# Patient Record
Sex: Female | Born: 1935 | Race: White | Hispanic: No | Smoking: Never smoker
Health system: Southern US, Community
[De-identification: ages and names within clinical notes are randomized; demographics above are authoritative.]

## PROBLEM LIST (undated history)

## (undated) DIAGNOSIS — N3941 Urge incontinence: Secondary | ICD-10-CM

## (undated) DIAGNOSIS — N390 Urinary tract infection, site not specified: Secondary | ICD-10-CM

## (undated) DIAGNOSIS — F0390 Unspecified dementia without behavioral disturbance: Secondary | ICD-10-CM

## (undated) DIAGNOSIS — I10 Essential (primary) hypertension: Secondary | ICD-10-CM

## (undated) DIAGNOSIS — F419 Anxiety disorder, unspecified: Secondary | ICD-10-CM

## (undated) DIAGNOSIS — I2510 Atherosclerotic heart disease of native coronary artery without angina pectoris: Secondary | ICD-10-CM

## (undated) DIAGNOSIS — D649 Anemia, unspecified: Secondary | ICD-10-CM

## (undated) DIAGNOSIS — E039 Hypothyroidism, unspecified: Secondary | ICD-10-CM

## (undated) DIAGNOSIS — E876 Hypokalemia: Secondary | ICD-10-CM

---

## 2012-08-28 ENCOUNTER — Emergency Department: Payer: Self-pay | Admitting: Emergency Medicine

## 2013-04-01 ENCOUNTER — Emergency Department: Payer: Self-pay | Admitting: Emergency Medicine

## 2015-02-22 ENCOUNTER — Emergency Department: Payer: Medicare Other

## 2015-02-22 ENCOUNTER — Emergency Department
Admission: EM | Admit: 2015-02-22 | Discharge: 2015-02-22 | Disposition: A | Discharge status: Home / Self Care | Payer: Medicare Other | Attending: Student | Admitting: Student

## 2015-02-22 DIAGNOSIS — Z043 Encounter for examination and observation following other accident: Secondary | ICD-10-CM | POA: Insufficient documentation

## 2015-02-22 DIAGNOSIS — Y939 Activity, unspecified: Secondary | ICD-10-CM | POA: Diagnosis not present

## 2015-02-22 DIAGNOSIS — Y929 Unspecified place or not applicable: Secondary | ICD-10-CM | POA: Diagnosis not present

## 2015-02-22 DIAGNOSIS — I10 Essential (primary) hypertension: Secondary | ICD-10-CM | POA: Insufficient documentation

## 2015-02-22 DIAGNOSIS — Z79899 Other long term (current) drug therapy: Secondary | ICD-10-CM | POA: Insufficient documentation

## 2015-02-22 DIAGNOSIS — Y999 Unspecified external cause status: Secondary | ICD-10-CM | POA: Insufficient documentation

## 2015-02-22 DIAGNOSIS — F0390 Unspecified dementia without behavioral disturbance: Secondary | ICD-10-CM | POA: Diagnosis not present

## 2015-02-22 DIAGNOSIS — W19XXXA Unspecified fall, initial encounter: Secondary | ICD-10-CM

## 2015-02-22 DIAGNOSIS — W01198A Fall on same level from slipping, tripping and stumbling with subsequent striking against other object, initial encounter: Secondary | ICD-10-CM | POA: Insufficient documentation

## 2015-02-22 HISTORY — DX: Hypothyroidism, unspecified: E03.9

## 2015-02-22 HISTORY — DX: Urge incontinence: N39.41

## 2015-02-22 HISTORY — DX: Hypokalemia: E87.6

## 2015-02-22 HISTORY — DX: Urinary tract infection, site not specified: N39.0

## 2015-02-22 HISTORY — DX: Atherosclerotic heart disease of native coronary artery without angina pectoris: I25.10

## 2015-02-22 HISTORY — DX: Essential (primary) hypertension: I10

## 2015-02-22 HISTORY — DX: Anemia, unspecified: D64.9

## 2015-02-22 HISTORY — DX: Anxiety disorder, unspecified: F41.9

## 2015-02-22 LAB — URINALYSIS COMPLETE WITH MICROSCOPIC (ARMC ONLY)
BACTERIA UA: NONE SEEN
Bilirubin Urine: NEGATIVE
Glucose, UA: NEGATIVE mg/dL
Hgb urine dipstick: NEGATIVE
KETONES UR: NEGATIVE mg/dL
Nitrite: NEGATIVE
Protein, ur: NEGATIVE mg/dL
Specific Gravity, Urine: 1.009 (ref 1.005–1.030)
pH: 6 (ref 5.0–8.0)

## 2015-02-22 NOTE — Discharge Instructions (Signed)
Fall Prevention and Home Safety  Falls cause injuries and can affect all age groups. It is possible to use preventive measures to significantly decrease the likelihood of falls. There are many simple measures which can make your home safer and prevent falls.  OUTDOORS   Repair cracks and edges of walkways and driveways.   Remove high doorway thresholds.   Trim shrubbery on the main path into your home.   Have good outside lighting.   Clear walkways of tools, rocks, debris, and clutter.   Check that handrails are not broken and are securely fastened. Both sides of steps should have handrails.   Have leaves, snow, and ice cleared regularly.   Use sand or salt on walkways during winter months.   In the garage, clean up grease or oil spills.  BATHROOM   Install night lights.   Install grab bars by the toilet and in the tub and shower.   Use non-skid mats or decals in the tub or shower.   Place a plastic non-slip stool in the shower to sit on, if needed.   Keep floors dry and clean up all water on the floor immediately.   Remove soap buildup in the tub or shower on a regular basis.   Secure bath mats with non-slip, double-sided rug tape.   Remove throw rugs and tripping hazards from the floors.  BEDROOMS   Install night lights.   Make sure a bedside light is easy to reach.   Do not use oversized bedding.   Keep a telephone by your bedside.   Have a firm chair with side arms to use for getting dressed.   Remove throw rugs and tripping hazards from the floor.  KITCHEN   Keep handles on pots and pans turned toward the center of the stove. Use back burners when possible.   Clean up spills quickly and allow time for drying.   Avoid walking on wet floors.   Avoid hot utensils and knives.   Position shelves so they are not too high or low.   Place commonly used objects within easy reach.   If necessary, use a sturdy step stool with a grab bar when reaching.   Keep electrical cables out of the  way.   Do not use floor polish or wax that makes floors slippery. If you must use wax, use non-skid floor wax.   Remove throw rugs and tripping hazards from the floor.  STAIRWAYS   Never leave objects on stairs.   Place handrails on both sides of stairways and use them. Fix any loose handrails. Make sure handrails on both sides of the stairways are as long as the stairs.   Check carpeting to make sure it is firmly attached along stairs. Make repairs to worn or loose carpet promptly.   Avoid placing throw rugs at the top or bottom of stairways, or properly secure the rug with carpet tape to prevent slippage. Get rid of throw rugs, if possible.   Have an electrician put in a light switch at the top and bottom of the stairs.  OTHER FALL PREVENTION TIPS   Wear low-heel or rubber-soled shoes that are supportive and fit well. Wear closed toe shoes.   When using a stepladder, make sure it is fully opened and both spreaders are firmly locked. Do not climb a closed stepladder.   Add color or contrast paint or tape to grab bars and handrails in your home. Place contrasting color strips on first and last   steps.   Learn and use mobility aids as needed. Install an electrical emergency response system.   Turn on lights to avoid dark areas. Replace light bulbs that burn out immediately. Get light switches that glow.   Arrange furniture to create clear pathways. Keep furniture in the same place.   Firmly attach carpet with non-skid or double-sided tape.   Eliminate uneven floor surfaces.   Select a carpet pattern that does not visually hide the edge of steps.   Be aware of all pets.  OTHER HOME SAFETY TIPS   Set the water temperature for 120 F (48.8 C).   Keep emergency numbers on or near the telephone.   Keep smoke detectors on every level of the home and near sleeping areas.  Document Released: 09/22/2002 Document Revised: 04/02/2012 Document Reviewed: 12/22/2011  ExitCare Patient Information 2015  [employer]. This information is not intended to replace advice given to you by your health care provider. Make sure you discuss any questions you have with your health care provider.      Dementia  Dementia is a general term for problems with brain function. A person with dementia has memory loss and a hard time with at least one other brain function such as thinking, speaking, or problem solving. Dementia can affect social functioning, how you do your job, your mood, or your personality. The changes may be hidden for a long time. The earliest forms of this disease are usually not detected by family or friends.  Dementia can be:   Irreversible.   Potentially reversible.   Partially reversible.   Progressive. This means it can get worse over time.  CAUSES   Irreversible dementia causes may include:   Degeneration of brain cells (Alzheimer disease or Lewy body dementia).   Multiple small strokes (vascular dementia).   Infection (chronic meningitis or Creutzfeldt-Jakob disease).   Frontotemporal dementia. This affects younger people, age 40 to 70, compared to those who have Alzheimer disease.   Dementia associated with other disorders like Parkinson disease, Huntington disease, or HIV-associated dementia.  Potentially or partially reversible dementia causes may include:   Medicines.   Metabolic causes such as excessive alcohol intake, vitamin B12 deficiency, or thyroid disease.   Masses or pressure in the brain such as a tumor, blood clot, or hydrocephalus.  SIGNS AND SYMPTOMS   Symptoms are often hard to detect. Family members or coworkers may not notice them early in the disease process. Different people with dementia may have different symptoms. Symptoms can include:   A hard time with memory, especially recent memory. Long-term memory may not be impaired.   Asking the same question multiple times or forgetting something someone just said.   A hard time speaking your thoughts or finding certain  words.   A hard time solving problems or performing familiar tasks (such as how to use a telephone).   Sudden changes in mood.   Changes in personality, especially increasing moodiness or mistrust.   Depression.   A hard time understanding complex ideas that were never a problem in the past.  DIAGNOSIS   There are no specific tests for dementia.    Your health care provider may recommend a thorough evaluation. This is because some forms of dementia can be reversible. The evaluation will likely include a physical exam and getting a detailed history from you and a family member. The history often gives the best clues and suggestions for a diagnosis.   Memory testing may be done. A   detailed brain function evaluation called neuropsychologic testing may be helpful.   Lab tests and brain imaging (such as a CT scan or MRI scan) are sometimes important.   Sometimes observation and re-evaluation over time is very helpful.  TREATMENT   Treatment depends on the cause.    If the problem is a vitamin deficiency, it may be helped or cured with supplements.   For dementias such as Alzheimer disease, medicines are available to stabilize or slow the course of the disease. There are no cures for this type of dementia.   Your health care provider can help direct you to groups, organizations, and other health care providers to help with decisions in the care of you or your loved one.  HOME CARE INSTRUCTIONS  The care of individuals with dementia is varied and dependent upon the progression of the dementia. The following suggestions are intended for the person living with, or caring for, the person with dementia.   Create a safe environment.   Remove the locks on bathroom doors to prevent the person from accidentally locking himself or herself in.   Use childproof latches on kitchen cabinets and any place where cleaning supplies, chemicals, or alcohol are kept.   Use childproof covers in unused electrical  outlets.   Install childproof devices to keep doors and windows secured.   Remove stove knobs or install safety knobs and an automatic shut-off on the stove.   Lower the temperature on water heaters.   Label medicines and keep them locked up.   Secure knives, lighters, matches, power tools, and guns, and keep these items out of reach.   Keep the house free from clutter. Remove rugs or anything that might contribute to a fall.   Remove objects that might break and hurt the person.   Make sure lighting is good, both inside and outside.   Install grab rails as needed.   Use a monitoring device to alert you to falls or other needs for help.   Reduce confusion.   Keep familiar objects and people around.   Use night lights or dim lights at night.   Label items or areas.   Use reminders, notes, or directions for daily activities or tasks.   Keep a simple, consistent routine for waking, meals, bathing, dressing, and bedtime.   Create a calm, quiet environment.   Place large clocks and calendars prominently.   Display emergency numbers and home address near all telephones.   Use cues to establish different times of the day. An example is to open curtains to let the natural light in during the day.    Use effective communication.   Choose simple words and short sentences.   Use a gentle, calm tone of voice.   Be careful not to interrupt.   If the person is struggling to find a word or communicate a thought, try to provide the word or thought.   Ask one question at a time. Allow the person ample time to answer questions. Repeat the question again if the person does not respond.   Reduce nighttime restlessness.   Provide a comfortable bed.   Have a consistent nighttime routine.   Ensure a regular walking or physical activity schedule. Involve the person in daily activities as much as possible.   Limit napping during the day.   Limit caffeine.   Attend social events that stimulate rather than  overwhelm the senses.   Encourage good nutrition and hydration.   Reduce distractions during   meal times and snacks.   Avoid foods that are too hot or too cold.   Monitor chewing and swallowing ability.   Continue with routine vision, hearing, dental, and medical screenings.   Give medicines only as directed by the health care provider.   Monitor driving abilities. Do not allow the person to drive when safe driving is no longer possible.   Register with an identification program which could provide location assistance in the event of a missing person situation.  SEEK MEDICAL CARE IF:    New behavioral problems start such as moodiness, aggressiveness, or seeing things that are not there (hallucinations).   Any new problem with brain function happens. This includes problems with balance, speech, or falling a lot.   Problems with swallowing develop.   Any symptoms of other illness happen.  Small changes or worsening in any aspect of brain function can be a sign that the illness is getting worse. It can also be a sign of another medical illness such as infection. Seeing a health care provider right away is important.  SEEK IMMEDIATE MEDICAL CARE IF:    A fever develops.   New or worsened confusion develops.   New or worsened sleepiness develops.   Staying awake becomes hard to do.  Document Released: 03/28/2001 Document Revised: 02/16/2014 Document Reviewed: 02/27/2011  [employer] Patient Information 2015 [employer]. This information is not intended to replace advice given to you by your health care provider. Make sure you discuss any questions you have with your health care provider.

## 2015-02-22 NOTE — ED Notes (Signed)
Called report to Callawayherrelle, [facility]. She is aware of patients disposition and that she will be transported back to the facility.

## 2015-02-22 NOTE — ED Notes (Signed)
[facility] "pt has been stumbling around and had a fall today they helped her up but not visible injuries.".  Pt typically ambulatory with no assist.  Pt has no complaints.  Pt alert to self on ly.

## 2015-02-22 NOTE — ED Notes (Signed)
Patient is resting comfortably. 

## 2015-02-22 NOTE — ED Provider Notes (Signed)
[facility] Emergency Department Provider Note  ____________________________________________  Time seen: Approximately 5:03 PM  I have reviewed the triage vital signs and the nursing notes.   HISTORY  Chief Complaint Fall  History of present illness limited second to the patient's chronic dementia.  HPI Wanda Mitchell is a 81 y.o. female history of hypertension, dementia, anemia, frequent urinary tract infections presents for evaluation of witnessed fall. The patient was walking in the courtyard at [facility] where she lives, she was witnessed to lose her balance, and fell hitting her head, no loss of consciousness. When staff attempted to stand her up she seemed "unsteady" on her feet. Staff is also concerned that she may have a urinary tract infection given that her urine has had a strong odor recently. This occurred suddenly today. The patient does not appear to be in any pain or complain of pain. Nothing makes her symptoms better or worse. According to staff at her living facility she has otherwise been in her usual state of health without recent illness.   Past Medical History  Diagnosis Date  . UTI (urinary tract infection)   . Urgency incontinence   . CAD (coronary artery disease)   . Hypothyroid   . Hypertension   . Anemia   . Hypokalemia   . Anxiety     There are no active problems to display for this patient.   History reviewed. No pertinent past surgical history.  Current Outpatient Rx  Name  Route  Sig  Dispense  Refill  . acetaminophen (TYLENOL) 325 MG tablet   Oral   Take 325 mg by mouth every 4 (four) hours as needed for mild pain, fever or headache.         . Cholecalciferol 1000 UNITS tablet   Oral   Take 1,000 Units by mouth every morning.         . divalproex (DEPAKOTE SPRINKLE) 125 MG capsule   Oral   Take 250 mg by mouth 2 (two) times daily.         Marland Kitchen docusate sodium (COLACE) 100 MG capsule   Oral   Take 100 mg  by mouth 2 (two) times daily.         . furosemide (LASIX) 20 MG tablet   Oral   Take 20 mg by mouth daily.         . hydrocortisone 2.5 % cream   Topical   Apply 1 application topically 2 (two) times daily.         Marland Kitchen menthol-cetylpyridinium (CEPACOL) 3 MG lozenge   Oral   Take 1 lozenge by mouth as needed for sore throat.         . Multiple Vitamins-Minerals (MULTIVITAMIN ADULT PO)   Oral   Take 1 tablet by mouth daily.         . potassium chloride (MICRO-K) 10 MEQ CR capsule   Oral   Take 10 mEq by mouth daily.         . QUEtiapine (SEROQUEL) 25 MG tablet   Oral   Take 12.5 mg by mouth 2 (two) times daily.           Allergies Review of patient's allergies indicates no known allergies.  No family history on file.  Social History History  Substance Use Topics  . Smoking status: Never Smoker   . Smokeless tobacco: Not on file  . Alcohol Use: No    Review of Systems Constitutional: No fever/chills  Cardiovascular: Denies chest pain. Respiratory: Denies shortness of breath. Gastrointestinal: no vomiting.  No diarrhea. Genitourinary: +strong-smelling urine  Patient unable to contribute to review of systems secondary to dementia. Review of systems partially obtained [facility].  ____________________________________________   PHYSICAL EXAM:  VITAL SIGNS: ED Triage Vitals  Enc Vitals Group     BP 02/22/15 1658 103/47 mmHg     Pulse Rate 02/22/15 1658 79     Resp 02/22/15 1658 18     Temp 02/22/15 1658 98.4 F (36.9 C)     Temp Source 02/22/15 1658 Oral     SpO2 02/22/15 1658 97 %     Weight 02/22/15 1658 90 lb (40.824 kg)     Height 02/22/15 1658 5' (1.524 m)     Head Cir --      Peak Flow --      Pain Score --      Pain Loc --      Pain Edu? --      Excl. in GC? --     Constitutional: Alert; pleasantly demented. Well appearing and in no acute distress. Eyes: Conjunctivae are normal. PERRL. EOMI. Head:  Atraumatic. Nose: No congestion/rhinnorhea. Mouth/Throat: Mucus membranes are moist.  Oropharynx non-erythematous. Neck: No stridor. No cervical spine tenderness to palpation. Cardiovascular: Normal rate, regular rhythm. Grossly normal heart sounds.  Good peripheral circulation. Respiratory: Normal respiratory effort.  No retractions. Lungs CTAB. Gastrointestinal: Soft and nontender. No distention. No abdominal bruits. No CVA tenderness. Genitourinary: deferred Musculoskeletal: No lower extremity tenderness nor edema.  No joint effusions. No midline tenderness throughout the T or L-spine, pelvis is stable to rock and compression, full range of motion of bilateral hips Neurologic: No gross focal neurologic deficits are appreciated. Speech is normal. No gait instability. Skin:  Skin is warm, dry and intact. No rash noted. Psychiatric: Mood and affect are normal. Speech and behavior are normal.  ____________________________________________   LABS (all labs ordered are listed, but only abnormal results are displayed)  Labs Reviewed  URINALYSIS COMPLETEWITH MICROSCOPIC ([facility])  - Abnormal; Notable for the following:    Color, Urine YELLOW (*)    APPearance CLEAR (*)    Leukocytes, UA TRACE (*)    Squamous Epithelial / LPF 0-5 (*)    All other components within normal limits  URINE CULTURE   ____________________________________________  EKG  none ____________________________________________  RADIOLOGY  CLINICAL DATA: Stomach in around. Fall.  EXAM: CT HEAD WITHOUT CONTRAST  CT CERVICAL SPINE WITHOUT CONTRAST  TECHNIQUE: Multidetector CT imaging of the head and cervical spine was performed following the standard protocol without intravenous contrast. Multiplanar CT image reconstructions of the cervical spine were also generated.  COMPARISON: 04/01/2013  FINDINGS: CT HEAD FINDINGS  There is prominence of the sulci and ventricles compatible with brain atrophy.  Mild low attenuation within the subcortical and periventricular white matter is noted. The midline is maintained. There is no acute intracranial hemorrhage or mass identified.  CT CERVICAL SPINE FINDINGS  There is an anterolisthesis of C3 on C4 which is likely related to spondylosis. Straightening of normal cervical lordosis is noted. There is multi level disc space narrowing and ventral endplate spurring which extends from C3-4 through C7-T1. The facet joints appear aligned. There is no acute fracture or subluxation identified. Visualize lung apices appear clear.  IMPRESSION: 1. No acute intracranial abnormalities. 2. Small vessel ischemic disease and brain atrophy. 3. No evidence for cervical spine fracture. 4. Advanced cervical degenerative disc disease.  Electronically Signed  By: Signa Kellaylor Stroud M.D.  On: 02/22/2015 18:34     ____________________________________________   PROCEDURES  Procedure(s) performed: None  Critical Care performed: No  ____________________________________________   INITIAL IMPRESSION / ASSESSMENT AND PLAN / ED COURSE  Pertinent labs & imaging results that were available during my care of the patient were reviewed by me and considered in my medical decision making (see chart for details).  Wanda Mitchell is a 81 y.o. female history of hypertension, dementia, anemia, frequent urinary tract infections presents for evaluation of witnessed fall with head injury though her entire exam is atraumatic and she generally appears quite well. Will obtain CT head and C-spine given that she is daily aspirin according to the [facility] from her facility, will get urinalysis.  ----------------------------------------- 7:50 PM on 02/22/2015 -----------------------------------------  Imaging negative.VSS. Urinalysis is not consistent with urinary tract infection. We'll send culture. DC home. ____________________________________________   FINAL CLINICAL  IMPRESSION(S) / ED DIAGNOSES  Final diagnoses:  Fall, initial encounter  Dementia, without behavioral disturbance      Gayla DossEryka A Amalia Edgecombe, MD 02/22/15 [phone]

## 2015-02-22 NOTE — ED Notes (Signed)
Pt ready for discharge,   Resting quietly in NAD.   Chart given to ED secretary to call for transport back to [facility].

## 2016-03-07 ENCOUNTER — Emergency Department
Admission: EM | Admit: 2016-03-07 | Discharge: 2016-03-07 | Disposition: A | Discharge status: Home / Self Care | Payer: Medicare Other | Attending: Emergency Medicine | Admitting: Emergency Medicine

## 2016-03-07 ENCOUNTER — Emergency Department: Payer: Medicare Other

## 2016-03-07 ENCOUNTER — Encounter: Payer: Self-pay | Admitting: Occupational Medicine

## 2016-03-07 DIAGNOSIS — I2510 Atherosclerotic heart disease of native coronary artery without angina pectoris: Secondary | ICD-10-CM | POA: Diagnosis not present

## 2016-03-07 DIAGNOSIS — W19XXXA Unspecified fall, initial encounter: Secondary | ICD-10-CM | POA: Insufficient documentation

## 2016-03-07 DIAGNOSIS — F0390 Unspecified dementia without behavioral disturbance: Secondary | ICD-10-CM | POA: Diagnosis not present

## 2016-03-07 DIAGNOSIS — Z79899 Other long term (current) drug therapy: Secondary | ICD-10-CM | POA: Insufficient documentation

## 2016-03-07 DIAGNOSIS — Y999 Unspecified external cause status: Secondary | ICD-10-CM | POA: Insufficient documentation

## 2016-03-07 DIAGNOSIS — I10 Essential (primary) hypertension: Secondary | ICD-10-CM | POA: Diagnosis not present

## 2016-03-07 DIAGNOSIS — Y92002 Bathroom of unspecified non-institutional (private) residence single-family (private) house as the place of occurrence of the external cause: Secondary | ICD-10-CM | POA: Insufficient documentation

## 2016-03-07 DIAGNOSIS — Y939 Activity, unspecified: Secondary | ICD-10-CM | POA: Diagnosis not present

## 2016-03-07 DIAGNOSIS — S0990XA Unspecified injury of head, initial encounter: Secondary | ICD-10-CM | POA: Diagnosis present

## 2016-03-07 DIAGNOSIS — E039 Hypothyroidism, unspecified: Secondary | ICD-10-CM | POA: Diagnosis not present

## 2016-03-07 DIAGNOSIS — S0101XA Laceration without foreign body of scalp, initial encounter: Secondary | ICD-10-CM | POA: Diagnosis not present

## 2016-03-07 MED ORDER — TRAMADOL HCL 50 MG PO TABS
50.0000 mg | ORAL_TABLET | ORAL | Status: AC
  Administered 2016-03-07: 50 mg via ORAL
  Filled 2016-03-07: qty 1

## 2016-03-07 NOTE — ED Notes (Signed)
MD at bedside, 2 Staples placed to head laceration.

## 2016-03-07 NOTE — ED Notes (Addendum)
Patient presents to Emergency Department via EMS with complaints of arm pain right side, head laceration s/p fall unwitness found on the floor next to bed.  [facility] from [facility].

## 2016-03-07 NOTE — ED Provider Notes (Signed)
[facility] Emergency Department Provider Note  ____________________________________________  Time seen: Approximately 2:25 AM  I have reviewed the triage vital signs and the nursing notes.   HISTORY  Chief Complaint Fall and Head Laceration  History limitations:  Patient has advanced dementia and is nearly deaf  HPI Wanda Mitchell is a 82 y.o. female with chronic dementia who resides at a memory care unit presents by EMS for evaluation after an unwitnessed fall.  She has an obvious laceration with controlled bleeding to the right side of her head.  She has no complaints right now except for a mild headache.  She denies neck pain.  The nursing staff believes that she got up to go to the bathroom and fell, but this cannot be corroborated.  She has no obvious injuries to her extremities and is in no acute distress.  She was placed in a c-collar by EMS.Given the patient's inability to remember the incident we cannot quantify the severity although at rest time the head injury appears mild although there is a significant amount of dried blood from the head wound.    Past Medical History  Diagnosis Date  . UTI (urinary tract infection)   . Urgency incontinence   . CAD (coronary artery disease)   . Hypothyroid   . Hypertension   . Anemia   . Hypokalemia   . Anxiety     There are no active problems to display for this patient.   History reviewed. No pertinent past surgical history.  Current Outpatient Rx  Name  Route  Sig  Dispense  Refill  . acetaminophen (TYLENOL) 325 MG tablet   Oral   Take 325 mg by mouth every 4 (four) hours as needed for mild pain, fever or headache.         . Cholecalciferol 1000 UNITS tablet   Oral   Take 1,000 Units by mouth every morning.         . divalproex (DEPAKOTE SPRINKLE) 125 MG capsule   Oral   Take 250 mg by mouth 2 (two) times daily.         Marland Kitchen docusate sodium (COLACE) 100 MG capsule   Oral   Take 100  mg by mouth 2 (two) times daily.         . furosemide (LASIX) 20 MG tablet   Oral   Take 20 mg by mouth daily.         . hydrocortisone 2.5 % cream   Topical   Apply 1 application topically 2 (two) times daily.         Marland Kitchen menthol-cetylpyridinium (CEPACOL) 3 MG lozenge   Oral   Take 1 lozenge by mouth as needed for sore throat.         . Multiple Vitamins-Minerals (MULTIVITAMIN ADULT PO)   Oral   Take 1 tablet by mouth daily.         . potassium chloride (MICRO-K) 10 MEQ CR capsule   Oral   Take 10 mEq by mouth daily.         . QUEtiapine (SEROQUEL) 25 MG tablet   Oral   Take 12.5 mg by mouth 2 (two) times daily.           Allergies Review of patient's allergies indicates no known allergies.  No family history on file.  Social History Social History  Substance Use Topics  . Smoking status: Never Smoker   . Smokeless tobacco: None  . Alcohol Use: No  Review of Systems  Unable to obtain due to the patient's dementia ____________________________________________   PHYSICAL EXAM:  VITAL SIGNS: ED Triage Vitals  Enc Vitals Group     BP 03/07/16 0157 150/63 mmHg     Pulse Rate 03/07/16 0137 58     Resp 03/07/16 0137 14     Temp 03/07/16 0137 98.1 F (36.7 C)     Temp Source 03/07/16 0137 Oral     SpO2 03/07/16 0133 96 %     Weight 03/07/16 0137 90 lb (40.824 kg)     Height 03/07/16 0137 5\' 2"  (1.575 m)     Head Cir --      Peak Flow --      Pain Score --      Pain Loc --      Pain Edu? --      Excl. in GC? --     Constitutional: Awake and alert.  Elderly.  Very hard of hearing.  Oriented to her name. Eyes: Conjunctivae are normal. PERRL. EOMI. Head: Laceration to the right side of her head as described below in procedure note Ears:  Healthy appearing ear canals and TMs bilaterally without hemotympanum Nose: No congestion/rhinnorhea. Mouth/Throat: Mucous membranes are moist.  Oropharynx non-erythematous. Neck: No stridor.  No meningeal  signs.  No cervical spine tenderness to palpation. Cardiovascular: Normal rate, regular rhythm. Good peripheral circulation. Grossly normal heart sounds.   Respiratory: Normal respiratory effort.  No retractions. Lungs CTAB. Gastrointestinal: Soft and nontender. No distention.  Musculoskeletal: No lower extremity tenderness nor edema. No gross deformities of extremities.  I fully ranged her arms and her legs and she had no tenderness nor limitation of range of motion in any of her extremities with no obvious musculoskeletal injuries, lacerations, nor contusions. Neurologic:  Normal speech and language. No gross focal neurologic deficits are appreciated.  Skin:  Skin is warm, dry and intact. No rash noted.  Multiple old/chronic appearing bruises.   ____________________________________________   LABS (all labs ordered are listed, but only abnormal results are displayed)  Labs Reviewed - No data to display ____________________________________________  EKG  None ____________________________________________  RADIOLOGY   Ct Head Wo Contrast  03/07/2016  CLINICAL DATA:  Post unwitnessed fall with head laceration and right arm pain. EXAM: CT HEAD WITHOUT CONTRAST CT CERVICAL SPINE WITHOUT CONTRAST TECHNIQUE: Multidetector CT imaging of the head and cervical spine was performed following the standard protocol without intravenous contrast. Multiplanar CT image reconstructions of the cervical spine were also generated. COMPARISON:  CT 02/22/2015 FINDINGS: CT HEAD FINDINGS Stable generalized atrophy and mild chronic small vessel ischemia.No intracranial hemorrhage, mass effect, or midline shift. No hydrocephalus. The basilar cisterns are patent. No evidence of territorial infarct. No intracranial fluid collection. Calvarium is intact. Included paranasal sinuses and mastoid air cells are well aerated. CT CERVICAL SPINE FINDINGS No acute fracture or subluxation. Advanced multilevel degenerative change  throughout cervical spine. Diffuse disc space narrowing and endplate spurring. Multilevel facet arthropathy. Degenerative type anterolisthesis of C3 on C4 appears unchanged from prior. The dens is intact. There are no jumped or perched facets. IMPRESSION: 1. No acute intracranial abnormality. Stable atrophy and chronic small vessel ischemia. 2. Advanced degenerative change throughout cervical spine without acute fracture or subluxation. Electronically Signed   By: Rubye OaksMelanie  Ehinger M.D.   On: 03/07/2016 04:26   Ct Cervical Spine Wo Contrast  03/07/2016  CLINICAL DATA:  Post unwitnessed fall with head laceration and right arm pain. EXAM: CT HEAD WITHOUT CONTRAST CT  CERVICAL SPINE WITHOUT CONTRAST TECHNIQUE: Multidetector CT imaging of the head and cervical spine was performed following the standard protocol without intravenous contrast. Multiplanar CT image reconstructions of the cervical spine were also generated. COMPARISON:  CT 02/22/2015 FINDINGS: CT HEAD FINDINGS Stable generalized atrophy and mild chronic small vessel ischemia.No intracranial hemorrhage, mass effect, or midline shift. No hydrocephalus. The basilar cisterns are patent. No evidence of territorial infarct. No intracranial fluid collection. Calvarium is intact. Included paranasal sinuses and mastoid air cells are well aerated. CT CERVICAL SPINE FINDINGS No acute fracture or subluxation. Advanced multilevel degenerative change throughout cervical spine. Diffuse disc space narrowing and endplate spurring. Multilevel facet arthropathy. Degenerative type anterolisthesis of C3 on C4 appears unchanged from prior. The dens is intact. There are no jumped or perched facets. IMPRESSION: 1. No acute intracranial abnormality. Stable atrophy and chronic small vessel ischemia. 2. Advanced degenerative change throughout cervical spine without acute fracture or subluxation. Electronically Signed   By: Rubye Oaks M.D.   On: 03/07/2016 04:26     ____________________________________________   PROCEDURES  Procedure(s) performed: laceration repair, see procedure note(s).  LACERATION REPAIR Performed by: Loleta Rose Authorized by: Loleta Rose Consent: Verbal consent obtained. Risks and benefits: risks, benefits and alternatives were discussed Consent given by: patient Patient identity confirmed: provided demographic data Prepped and Draped in normal sterile fashion Wound explored  Laceration Location: right side of head  Laceration Length: 1 cm  No Foreign Bodies seen or palpated  Irrigation method: syringe Amount of cleaning: standard  Skin closure: 2 staples  Patient tolerance: Patient tolerated the procedure well with no immediate complications.   Critical Care performed: No ____________________________________________   INITIAL IMPRESSION / ASSESSMENT AND PLAN / ED COURSE  Pertinent labs & imaging results that were available during my care of the patient were reviewed by me and considered in my medical decision making (see chart for details).  Patient's son is at the bedside and I had an extensive discussion with him about the typical workup for patient's such as his mother.  He agrees with the plan for CT scans of her head and neck, laceration repair is warranted, and discharge back to the facility.  He states he is comfortable transporting her.  I am awaiting scan results and that we will proceed with wound care and treatment.  ----------------------------------------- 5:21 AM on 03/07/2016 -----------------------------------------  No acute abnormality on CT scans.  Repaired head laceration with 2 staples.  Son will take patient back to living facility.  ____________________________________________  FINAL CLINICAL IMPRESSION(S) / ED DIAGNOSES  Final diagnoses:  Fall, initial encounter  Head injury, initial encounter  Scalp laceration, initial encounter  Chronic dementia, without behavioral  disturbance     MEDICATIONS GIVEN DURING THIS VISIT:  Medications  traMADol (ULTRAM) tablet 50 mg (not administered)     NEW OUTPATIENT MEDICATIONS STARTED DURING THIS VISIT:  New Prescriptions   No medications on file      Note:  This document was prepared using Dragon voice recognition software and may include unintentional dictation errors.   Loleta Rose, MD 03/07/16 [phone]

## 2016-03-07 NOTE — ED Notes (Signed)
Patient transported to CT 

## 2016-03-07 NOTE — ED Notes (Signed)
Pts head and laceration cleaned

## 2016-03-07 NOTE — Discharge Instructions (Signed)
You have been seen in the Emergency Department (ED) today for a fall.  Your work up does not show any concerning injuries.  Please take over-the-counter ibuprofen and/or Tylenol as needed for your pain (unless you have an allergy or your doctor as told you not to take them), or take any prescribed medication as instructed.   Please follow up with your doctor regarding today's Emergency Department (ED) visit and your recent fall.   You will need your staples removed in about 7 days.  Return to the ED if you have any headache, confusion, slurred speech, weakness/numbness of any arm or leg, or any increased pain.   Head Injury, Adult You have a head injury. Headaches and throwing up (vomiting) are common after a head injury. It should be easy to wake up from sleeping. Sometimes you must stay in the hospital. Most problems happen within the first 24 hours. Side effects may occur up to 7-10 days after the injury.  WHAT ARE THE TYPES OF HEAD INJURIES? Head injuries can be as minor as a bump. Some head injuries can be more severe. More severe head injuries include:  A jarring injury to the brain (concussion).  A bruise of the brain (contusion). This mean there is bleeding in the brain that can cause swelling.  A cracked skull (skull fracture).  Bleeding in the brain that collects, clots, and forms a bump (hematoma). WHEN SHOULD I GET HELP RIGHT AWAY?   You are confused or sleepy.  You cannot be woken up.  You feel sick to your stomach (nauseous) or keep throwing up (vomiting).  Your dizziness or unsteadiness is getting worse.  You have very bad, lasting headaches that are not helped by medicine. Take medicines only as told by your doctor.  You cannot use your arms or legs like normal.  You cannot walk.  You notice changes in the black spots in the center of the colored part of your eye (pupil).  You have clear or bloody fluid coming from your nose or ears.  You have trouble  seeing. During the next 24 hours after the injury, you must stay with someone who can watch you. This person should get help right away (call 911 in the [place] if you start to shake and are not able to control it (have seizures), you pass out, or you are unable to wake up. HOW CAN I PREVENT A HEAD INJURY IN THE FUTURE?  Wear seat belts.  Wear a helmet while bike riding and playing sports like football.  Stay away from dangerous activities around the house. WHEN CAN I RETURN TO NORMAL ACTIVITIES AND ATHLETICS? See your doctor before doing these activities. You should not do normal activities or play contact sports until 1 week after the following symptoms have stopped:  Headache that does not go away.  Dizziness.  Poor attention.  Confusion.  Memory problems.  Sickness to your stomach or throwing up.  Tiredness.  Fussiness.  Bothered by bright lights or loud noises.  Anxiousness or depression.  Restless sleep. MAKE SURE YOU:   Understand these instructions.  Will watch your condition.  Will get help right away if you are not doing well or get worse.   This information is not intended to replace advice given to you by your health care provider. Make sure you discuss any questions you have with your health care provider.   Document Released: 09/14/2008 Document Revised: 10/23/2014 Document Reviewed: 06/09/2013 Elsevier Interactive Patient Education [employer]  Stitches, Staples, or Adhesive Wound Closure Doctors use stitches (sutures), staples, and certain glue (skin adhesives) to hold your skin together while it heals (wound closure). You may need this treatment after you have surgery or if you cut your skin accidentally. These methods help your skin heal more quickly. They also make it less likely that you will have a scar. WHAT ARE THE DIFFERENT KINDS OF WOUND CLOSURES? There are many options for wound closure. The one that your doctor uses depends on how deep  and large your wound is. Adhesive Glue To use this glue to close a wound, your doctor holds the edges of the wound together and paints the glue on the surface of your skin. You may need more than one layer of glue. Then the wound may be covered with a light bandage (dressing). This type of skin closure may be used for small wounds that are not deep (superficial). Using glue for wound closure is less painful than other methods. It does not require a medicine that numbs the area. This method also leaves nothing to be removed. Adhesive glue is often used for children and on facial wounds. Adhesive glue cannot be used for wounds that are deep, uneven, or bleeding. It is not used inside of a wound.  Adhesive Strips These strips are made of sticky (adhesive), porous paper. They are placed across your skin edges like a regular adhesive bandage. You leave them on until they fall off. Adhesive strips may be used to close very superficial wounds. They may also be used along with sutures to improve closure of your skin edges.  Sutures Sutures are the oldest method of wound closure. Sutures can be made from natural or synthetic materials. They can be made from a material that your body can break down as your wound heals (absorbable), or they can be made from a material that needs to be removed from your skin (nonabsorbable). They come in many different strengths and sizes. Your doctor attaches the sutures to a steel needle on one end. Sutures can be passed through your skin, or through the tissues beneath your skin. Then they are tied and cut. Your skin edges may be closed in one continuous stitch or in separate stitches. Sutures are strong and can be used for all kinds of wounds. Absorbable sutures may be used to close tissues under the skin. The disadvantage of sutures is that they may cause skin reactions that lead to infection. Nonabsorbable sutures need to be removed. Staples When surgical staples are used to  close a wound, the edges of your skin on both sides of the wound are brought close together. A staple is placed across the wound, and an instrument secures the edges together. Staples are often used to close surgical cuts (incisions). Staples are faster to use than sutures, and they cause less reaction from your skin. Staples need to be removed using a tool that bends the staples away from your skin. HOW DO I CARE FOR MY WOUND CLOSURE?  Take medicines only as told by your doctor.  If you were prescribed an antibiotic medicine for your wound, finish it all even if you start to feel better.  Use ointments or creams only as told by your doctor.  Wash your hands with soap and water before and after touching your wound.  Do not soak your wound in water. Do not take baths, swim, or use a hot tub until your doctor says it is okay.  Ask your doctor  when you can start showering. Cover your wound if told by your doctor.  Do not take out your own sutures or staples.  Do not pick at your wound. Picking can cause an infection.  Keep all follow-up visits as told by your doctor. This is important. HOW LONG WILL I HAVE MY WOUND CLOSURE?   Leave adhesive glue on your skin until the glue peels away.  Leave adhesive strips on your skin until they fall off.  Absorbable sutures will dissolve within several days.  Nonabsorbable sutures and staples must be removed. The location of the wound will determine how long they stay in. This can range from several days to a couple of weeks. WHEN SHOULD I SEEK HELP FOR MY WOUND CLOSURE? Contact your doctor if:  You have a fever.  You have chills.  You have redness, puffiness (swelling), or pain at the site of your wound.  You have fluid, blood, or pus coming from your wound.  There is a bad smell coming from your wound.  The skin edges of your wound start to separate after your sutures have been removed.  Your wound becomes thick, raised, and darker in  color after your sutures come out (scarring).   This information is not intended to replace advice given to you by your health care provider. Make sure you discuss any questions you have with your health care provider.   Document Released: 07/30/2009 Document Revised: 10/23/2014 Document Reviewed: 03/11/2014 Elsevier Interactive Patient Education [employer]

## 2016-05-27 ENCOUNTER — Emergency Department: Payer: Medicare Other

## 2016-05-27 ENCOUNTER — Emergency Department
Admission: EM | Admit: 2016-05-27 | Discharge: 2016-05-27 | Disposition: A | Discharge status: Home / Self Care | Payer: Medicare Other | Attending: Emergency Medicine | Admitting: Emergency Medicine

## 2016-05-27 DIAGNOSIS — E039 Hypothyroidism, unspecified: Secondary | ICD-10-CM | POA: Diagnosis not present

## 2016-05-27 DIAGNOSIS — W19XXXA Unspecified fall, initial encounter: Secondary | ICD-10-CM | POA: Insufficient documentation

## 2016-05-27 DIAGNOSIS — Z791 Long term (current) use of non-steroidal anti-inflammatories (NSAID): Secondary | ICD-10-CM | POA: Insufficient documentation

## 2016-05-27 DIAGNOSIS — N390 Urinary tract infection, site not specified: Secondary | ICD-10-CM | POA: Diagnosis not present

## 2016-05-27 DIAGNOSIS — S0990XA Unspecified injury of head, initial encounter: Secondary | ICD-10-CM | POA: Diagnosis present

## 2016-05-27 DIAGNOSIS — Y999 Unspecified external cause status: Secondary | ICD-10-CM | POA: Diagnosis not present

## 2016-05-27 DIAGNOSIS — I2510 Atherosclerotic heart disease of native coronary artery without angina pectoris: Secondary | ICD-10-CM | POA: Diagnosis not present

## 2016-05-27 DIAGNOSIS — S0101XA Laceration without foreign body of scalp, initial encounter: Secondary | ICD-10-CM | POA: Insufficient documentation

## 2016-05-27 DIAGNOSIS — Y939 Activity, unspecified: Secondary | ICD-10-CM | POA: Insufficient documentation

## 2016-05-27 DIAGNOSIS — IMO0002 Reserved for concepts with insufficient information to code with codable children: Secondary | ICD-10-CM

## 2016-05-27 DIAGNOSIS — Y929 Unspecified place or not applicable: Secondary | ICD-10-CM | POA: Insufficient documentation

## 2016-05-27 DIAGNOSIS — I10 Essential (primary) hypertension: Secondary | ICD-10-CM | POA: Diagnosis not present

## 2016-05-27 LAB — URINALYSIS COMPLETE WITH MICROSCOPIC (ARMC ONLY)
BACTERIA UA: NONE SEEN
BILIRUBIN URINE: NEGATIVE
GLUCOSE, UA: NEGATIVE mg/dL
HGB URINE DIPSTICK: NEGATIVE
Ketones, ur: NEGATIVE mg/dL
NITRITE: NEGATIVE
Protein, ur: NEGATIVE mg/dL
Specific Gravity, Urine: 1.014 (ref 1.005–1.030)
pH: 6 (ref 5.0–8.0)

## 2016-05-27 LAB — CBC
HCT: 40.5 % (ref 35.0–47.0)
HEMOGLOBIN: 13.8 g/dL (ref 12.0–16.0)
MCH: 30.6 pg (ref 26.0–34.0)
MCHC: 34 g/dL (ref 32.0–36.0)
MCV: 90 fL (ref 80.0–100.0)
PLATELETS: 217 10*3/uL (ref 150–440)
RBC: 4.5 MIL/uL (ref 3.80–5.20)
RDW: 13.5 % (ref 11.5–14.5)
WBC: 5.9 10*3/uL (ref 3.6–11.0)

## 2016-05-27 LAB — BASIC METABOLIC PANEL
ANION GAP: 4 — AB (ref 5–15)
BUN: 22 mg/dL — ABNORMAL HIGH (ref 6–20)
CALCIUM: 9.3 mg/dL (ref 8.9–10.3)
CO2: 30 mmol/L (ref 22–32)
Chloride: 99 mmol/L — ABNORMAL LOW (ref 101–111)
Creatinine, Ser: 0.93 mg/dL (ref 0.44–1.00)
GFR calc non Af Amer: 52 mL/min — ABNORMAL LOW (ref >60)
GFR, EST AFRICAN AMERICAN: 60 mL/min — AB (ref >60)
Glucose, Bld: 98 mg/dL (ref 65–99)
Potassium: 4.1 mmol/L (ref 3.5–5.1)
SODIUM: 133 mmol/L — AB (ref 135–145)

## 2016-05-27 MED ORDER — LIDOCAINE-EPINEPHRINE (PF) 1 %-1:200000 IJ SOLN
INTRAMUSCULAR | Status: AC
  Filled 2016-05-27: qty 30

## 2016-05-27 MED ORDER — CEPHALEXIN 500 MG PO CAPS: 500.0000 mg | ORAL_CAPSULE | Freq: Three times a day (TID) | ORAL | 0 refills | Status: DC

## 2016-05-27 NOTE — ED Provider Notes (Signed)
[facility] Emergency Department Provider Note  Time seen: 1:32 PM  I have reviewed the triage vital signs and the nursing notes.   HISTORY  Chief Complaint Altered Mental Status; Fall; and Laceration    HPI Wanda Mitchell is a 82 y.o. female with a past medical history of anemia, hypertension, UTIs, who presents the emergency department after a fall. Per report the fall was not witnessed and patient was found on the ground. Patient has a history of dementia. Patient has an obvious laceration to the right scalp. Patient cannot contribute to her history.  Past Medical History:  Diagnosis Date  . Anemia   . Anxiety   . CAD (coronary artery disease)   . Hypertension   . Hypokalemia   . Hypothyroid   . Urgency incontinence   . UTI (urinary tract infection)     There are no active problems to display for this patient.   History reviewed. No pertinent surgical history.  Prior to Admission medications   Medication Sig Start Date End Date Taking? Authorizing Provider  acetaminophen (TYLENOL) 325 MG tablet Take 325 mg by mouth every 4 (four) hours as needed for mild pain, fever or headache.    Historical Provider, MD  Cholecalciferol 1000 UNITS tablet Take 1,000 Units by mouth every morning.    Historical Provider, MD  divalproex (DEPAKOTE SPRINKLE) 125 MG capsule Take 250 mg by mouth 2 (two) times daily.    Historical Provider, MD  docusate sodium (COLACE) 100 MG capsule Take 100 mg by mouth 2 (two) times daily.    Historical Provider, MD  furosemide (LASIX) 20 MG tablet Take 20 mg by mouth daily.    Historical Provider, MD  hydrocortisone 2.5 % cream Apply 1 application topically 2 (two) times daily.    Historical Provider, MD  menthol-cetylpyridinium (CEPACOL) 3 MG lozenge Take 1 lozenge by mouth as needed for sore throat.    Historical Provider, MD  Multiple Vitamins-Minerals (MULTIVITAMIN ADULT PO) Take 1 tablet by mouth daily.    Historical Provider, MD   potassium chloride (MICRO-K) 10 MEQ CR capsule Take 10 mEq by mouth daily.    Historical Provider, MD  QUEtiapine (SEROQUEL) 25 MG tablet Take 12.5 mg by mouth 2 (two) times daily.    Historical Provider, MD    No Known Allergies  No family history on file.  Social History Social History  Substance Use Topics  . Smoking status: Never Smoker  . Smokeless tobacco: Never Used  . Alcohol use No    Review of Systems Constitutional: Negative for fever. Cardiovascular: Negative for chest pain. Respiratory: Negative for shortness of breath. Gastrointestinal: Negative for abdominal pain  10-point ROS otherwise negative.Largely negative review of systems however limited by dementia.  ____________________________________________   PHYSICAL EXAM:  VITAL SIGNS: ED Triage Vitals [05/27/16 1226]  Enc Vitals Group     BP (!) 158/71     Pulse Rate 60     Resp 18     Temp 97.8 F (36.6 C)     Temp Source Axillary     SpO2 100 %     Weight      Height      Head Circumference      Peak Flow      Pain Score      Pain Loc      Pain Edu?      Excl. in GC?     Constitutional: Alert. Well appearing and in no distress. Eyes: Normal  exam ENT   Head: 2 cm laceration to right parietal scalp, hemostatic.   Mouth/Throat: Mucous membranes are moist. Cardiovascular: Normal rate, regular rhythm.  Respiratory: Normal respiratory effort without tachypnea nor retractions. Breath sounds are clear  Gastrointestinal: Soft and nontender. No distention.   Musculoskeletal: Nontender with normal range of motion in all extremities. No lower extremity tenderness or edema. No C-spine tenderness. Good range of motion in all extremities without tenderness elicited. Pelvis is stable. Neurologic:  Normal speech and language. No gross focal neurologic deficits are appreciated. Skin:  Skin is warm, dry and intact.  Psychiatric: Mood and affect are normal. Speech and behavior are normal.    ____________________________________________    EKG  EKG reviewed and interpreted by myself shows normal sinus rhythm at 65 bpm, narrow QRS, normal axis, normal intervals, no concerning ST changes.  ____________________________________________    RADIOLOGY  Chest x-ray, pelvic x-ray negative CT head CT C-spine showed no acute abnormality.  ____________________________________________   INITIAL IMPRESSION / ASSESSMENT AND PLAN / ED COURSE  Pertinent labs & imaging results that were available during my care of the patient were reviewed by me and considered in my medical decision making (see chart for details).  The patient presents to the emergency department after an unwitnessed fall. Patient's workup is largely within normal limits. Imaging is negative. Patient's urinalysis does show urinary tract infection, urine culture has been sent and we will start the patient on Keflex. Laceration repaired with 2 staples, remains hemostatic.  LACERATION REPAIR Performed by: Minna AntisPADUCHOWSKI, Nola Botkins Authorized by: Minna AntisPADUCHOWSKI, Reagann Dolce Consent: Verbal consent obtained. Risks and benefits: risks, benefits and alternatives were discussed Consent given by: patient Patient identity confirmed: provided demographic data Prepped and Draped in normal sterile fashion Wound explored  Laceration Location: Right parietal scalp  Laceration Length: 2 cm  No Foreign Bodies seen or palpated  Anesthesia: local infiltration  Local anesthetic: lidocaine 1 % with epinephrine  Anesthetic total: 4 ml  Amount of cleaning: standard  Skin closure: Staples   Number of staples: 2   Patient tolerance: Patient tolerated the procedure well with no immediate complications.   ____________________________________________   FINAL CLINICAL IMPRESSION(S) / ED DIAGNOSES  Laceration Fall Head injury    Minna AntisKevin Damean Poffenberger, MD 05/27/16 1336

## 2016-05-27 NOTE — ED Triage Notes (Signed)
Patient from [facility] with hx dementia. Patient arrives today with unwitnessed fall, laceration, and AMS. Staff reported to EMS that the patient has been altered for 3 days and has been assessed for UTI (awaiting results). Patient presents with lac to the right side of her head.

## 2016-05-27 NOTE — ED Notes (Signed)
Patient stable at time of discharge. Transport to [facility] provided by [employer] [facility] (Daughter-In-Law)

## 2016-05-29 LAB — URINE CULTURE: Culture: >=100000 — AB

## 2016-06-03 ENCOUNTER — Emergency Department: Payer: Medicare Other

## 2016-06-03 ENCOUNTER — Encounter: Payer: Self-pay | Admitting: Emergency Medicine

## 2016-06-03 ENCOUNTER — Emergency Department
Admission: EM | Admit: 2016-06-03 | Discharge: 2016-06-03 | Disposition: A | Discharge status: Skilled Nursing Facility | Payer: Medicare Other | Attending: Emergency Medicine | Admitting: Emergency Medicine

## 2016-06-03 DIAGNOSIS — Z79899 Other long term (current) drug therapy: Secondary | ICD-10-CM | POA: Insufficient documentation

## 2016-06-03 DIAGNOSIS — W19XXXA Unspecified fall, initial encounter: Secondary | ICD-10-CM | POA: Diagnosis not present

## 2016-06-03 DIAGNOSIS — F0390 Unspecified dementia without behavioral disturbance: Secondary | ICD-10-CM | POA: Diagnosis not present

## 2016-06-03 DIAGNOSIS — I2510 Atherosclerotic heart disease of native coronary artery without angina pectoris: Secondary | ICD-10-CM | POA: Diagnosis not present

## 2016-06-03 DIAGNOSIS — Y92128 Other place in nursing home as the place of occurrence of the external cause: Secondary | ICD-10-CM | POA: Insufficient documentation

## 2016-06-03 DIAGNOSIS — Y939 Activity, unspecified: Secondary | ICD-10-CM | POA: Diagnosis not present

## 2016-06-03 DIAGNOSIS — E039 Hypothyroidism, unspecified: Secondary | ICD-10-CM | POA: Diagnosis not present

## 2016-06-03 DIAGNOSIS — T148 Other injury of unspecified body region: Secondary | ICD-10-CM | POA: Diagnosis present

## 2016-06-03 DIAGNOSIS — Y999 Unspecified external cause status: Secondary | ICD-10-CM | POA: Diagnosis not present

## 2016-06-03 DIAGNOSIS — I10 Essential (primary) hypertension: Secondary | ICD-10-CM | POA: Diagnosis not present

## 2016-06-03 DIAGNOSIS — E876 Hypokalemia: Secondary | ICD-10-CM | POA: Diagnosis not present

## 2016-06-03 DIAGNOSIS — T148XXA Other injury of unspecified body region, initial encounter: Secondary | ICD-10-CM

## 2016-06-03 LAB — URINALYSIS COMPLETE WITH MICROSCOPIC (ARMC ONLY)
BACTERIA UA: NONE SEEN
Bilirubin Urine: NEGATIVE
GLUCOSE, UA: NEGATIVE mg/dL
Hgb urine dipstick: NEGATIVE
Ketones, ur: NEGATIVE mg/dL
LEUKOCYTES UA: NEGATIVE
NITRITE: NEGATIVE
PROTEIN: NEGATIVE mg/dL
Specific Gravity, Urine: 1.013 (ref 1.005–1.030)
pH: 6 (ref 5.0–8.0)

## 2016-06-03 LAB — CBC
HCT: 39 % (ref 35.0–47.0)
HEMOGLOBIN: 13.7 g/dL (ref 12.0–16.0)
MCH: 31.3 pg (ref 26.0–34.0)
MCHC: 35.1 g/dL (ref 32.0–36.0)
MCV: 89.1 fL (ref 80.0–100.0)
PLATELETS: 173 10*3/uL (ref 150–440)
RBC: 4.37 MIL/uL (ref 3.80–5.20)
RDW: 13.4 % (ref 11.5–14.5)
WBC: 5.9 10*3/uL (ref 3.6–11.0)

## 2016-06-03 LAB — BASIC METABOLIC PANEL
ANION GAP: 5 (ref 5–15)
BUN: 17 mg/dL (ref 6–20)
CHLORIDE: 102 mmol/L (ref 101–111)
CO2: 28 mmol/L (ref 22–32)
Calcium: 8.6 mg/dL — ABNORMAL LOW (ref 8.9–10.3)
Creatinine, Ser: 0.78 mg/dL (ref 0.44–1.00)
GFR calc Af Amer: >60 mL/min (ref >60)
GLUCOSE: 94 mg/dL (ref 65–99)
POTASSIUM: 2.8 mmol/L — AB (ref 3.5–5.1)
SODIUM: 135 mmol/L (ref 135–145)

## 2016-06-03 LAB — TROPONIN I

## 2016-06-03 MED ORDER — LORAZEPAM 2 MG/ML IJ SOLN
0.5000 mg | Freq: Once | INTRAMUSCULAR | Status: AC
  Administered 2016-06-03: 0.5 mg via INTRAVENOUS

## 2016-06-03 MED ORDER — LORAZEPAM 2 MG/ML IJ SOLN
INTRAMUSCULAR | Status: AC
  Administered 2016-06-03: 0.5 mg via INTRAVENOUS
  Filled 2016-06-03: qty 1

## 2016-06-03 MED ORDER — POTASSIUM CHLORIDE 10 MEQ/100ML IV SOLN
10.0000 meq | INTRAVENOUS | Status: DC
  Administered 2016-06-03: 10 meq via INTRAVENOUS
  Filled 2016-06-03 (×3): qty 100

## 2016-06-03 MED ORDER — KETOROLAC TROMETHAMINE 30 MG/ML IJ SOLN
30.0000 mg | Freq: Once | INTRAMUSCULAR | Status: AC
  Administered 2016-06-03: 30 mg via INTRAVENOUS
  Filled 2016-06-03: qty 1

## 2016-06-03 MED ORDER — POTASSIUM CHLORIDE ER 10 MEQ PO TBCR: 10.0000 meq | EXTENDED_RELEASE_TABLET | Freq: Three times a day (TID) | ORAL | 0 refills | Status: AC

## 2016-06-03 MED ORDER — POTASSIUM CHLORIDE 20 MEQ PO PACK
40.0000 meq | PACK | Freq: Once | ORAL | Status: AC
  Administered 2016-06-03: 40 meq via ORAL
  Filled 2016-06-03: qty 2

## 2016-06-03 NOTE — ED Provider Notes (Signed)
[facility] Emergency Department Provider Note   ____________________________________________   First MD Initiated Contact with Patient 06/03/16 [phone]     (approximate)  I have reviewed the triage vital signs and the nursing notes.   HISTORY  Chief Complaint Fall  History is limited by patient's dementia  HPI Wanda Mitchell is a 82 y.o. female who comes into the hospital today after having an witnessed fall and moaning in pain. The patient was found moaning in pain at the nursing home. Although the patient was in the bed they're concerned that the patient has had a fall. The patient has had falls previously. She was here 7 days ago with these symptoms. According to EMS the patient is currently at her baseline. She has multiple bruises in multiple stages of healing. The patient is unable to tell me anything about what's going on. She is also unable to tell me exactly where she is hurting she just keeps moaning.   Past Medical History:  Diagnosis Date  . Anemia   . Anxiety   . CAD (coronary artery disease)   . Hypertension   . Hypokalemia   . Hypothyroid   . Urgency incontinence   . UTI (urinary tract infection)     There are no active problems to display for this patient.   History reviewed. No pertinent surgical history.  Prior to Admission medications   Medication Sig Start Date End Date Taking? Authorizing Provider  acetaminophen (TYLENOL) 325 MG tablet Take 325 mg by mouth every 4 (four) hours as needed for mild pain, fever or headache.    Historical Provider, MD  cephALEXin (KEFLEX) 500 MG capsule Take 1 capsule (500 mg total) by mouth 3 (three) times daily. 05/27/16   Minna AntisKevin Paduchowski, MD  Cholecalciferol 1000 UNITS tablet Take 1,000 Units by mouth every morning.    Historical Provider, MD  divalproex (DEPAKOTE SPRINKLE) 125 MG capsule Take 250 mg by mouth 2 (two) times daily.    Historical Provider, MD  docusate sodium (COLACE) 100 MG  capsule Take 100 mg by mouth 2 (two) times daily.    Historical Provider, MD  furosemide (LASIX) 20 MG tablet Take 20 mg by mouth daily.    Historical Provider, MD  hydrocortisone 2.5 % cream Apply 1 application topically 2 (two) times daily.    Historical Provider, MD  menthol-cetylpyridinium (CEPACOL) 3 MG lozenge Take 1 lozenge by mouth as needed for sore throat.    Historical Provider, MD  Multiple Vitamins-Minerals (MULTIVITAMIN ADULT PO) Take 1 tablet by mouth daily.    Historical Provider, MD  potassium chloride (MICRO-K) 10 MEQ CR capsule Take 10 mEq by mouth daily.    Historical Provider, MD  QUEtiapine (SEROQUEL) 25 MG tablet Take 12.5 mg by mouth 2 (two) times daily.    Historical Provider, MD    Allergies Review of patient's allergies indicates no known allergies.  History reviewed. No pertinent family history.  Social History Social History  Substance Use Topics  . Smoking status: Never Smoker  . Smokeless tobacco: Never Used  . Alcohol use No    Review of Systems  Skin: Multiple contusions in various stages of healing Neurological: Patient with baseline confusion  Unable to obtain a full review of systems due to patient dementia  ____________________________________________   PHYSICAL EXAM:  VITAL SIGNS: ED Triage Vitals  Enc Vitals Group     BP 06/03/16 0350 (!) 132/98     Pulse Rate 06/03/16 0350 61  Resp 06/03/16 0350 18     Temp 06/03/16 0350 98.6 F (37 C)     Temp Source 06/03/16 0350 Oral     SpO2 06/03/16 0350 98 %     Weight 06/03/16 0351 116 lb 13.5 oz (53 kg)     Height --      Head Circumference --      Peak Flow --      Pain Score --      Pain Loc --      Pain Edu? --      Excl. in GC? --     Constitutional: Alert and not oriented. Well appearing and in mild distress. Eyes: Conjunctivae are normal. PERRL. EOMI. Head: Atraumatic. Nose: No congestion/rhinnorhea. Mouth/Throat: Mucous membranes are moist.  Oropharynx  non-erythematous. Cardiovascular: Normal rate, regular rhythm. Grossly normal heart sounds.  Good peripheral circulation. Respiratory: Normal respiratory effort.  No retractions. Lungs CTAB. Gastrointestinal: Soft and nontender. No distention. Positive bowel sounds Musculoskeletal: No lower extremity tenderness nor edema.   Neurologic:  Normal speech and language. Patient unable to fully follow commands. Skin:  Skin is warm, dry and intact.  Psychiatric: Mood and affect are normal.   ____________________________________________   LABS (all labs ordered are listed, but only abnormal results are displayed)  Labs Reviewed  BASIC METABOLIC PANEL - Abnormal; Notable for the following:       Result Value   Potassium 2.8 (*)    Calcium 8.6 (*)    All other components within normal limits  CBC  TROPONIN I  URINALYSIS COMPLETEWITH MICROSCOPIC ([facility] ONLY)   ____________________________________________  EKG  ED ECG REPORT I, Rebecka Apley, the attending physician, personally viewed and interpreted this ECG.   Date: 06/03/2016  EKG Time: 730  Rate: 69  Rhythm: normal sinus rhythm  Axis: left axis deviation  Intervals:none  ST&T Change: none  ____________________________________________  RADIOLOGY  Right hip xray: No fracture or dislocation CT head and cervical spine: No evidence of acute intracranial injury or cervical spine fx CXR: No acute cardiopulmonary disease ____________________________________________   PROCEDURES  Procedure(s) performed: None  Procedures  Critical Care performed: No  ____________________________________________   INITIAL IMPRESSION / ASSESSMENT AND PLAN / ED COURSE  Pertinent labs & imaging results that were available during my care of the patient were reviewed by me and considered in my medical decision making (see chart for details).  This is a 82 year old female who comes into the hospital today after an unwitnessed fall at her  nursing home. The patient has multiple bruises in multiple stages of development. We will check the patient's blood work as well as some imaging to include a CT head and cervical spine as well as an x-ray of her hip. The patient will be reassessed.  Clinical Course   Given that the patient's potassium was low I did order for the patient to receive potassium. Initially I ordered 40 mEq orally but she wasn't doing a very good job attempted to take it so I did order 30 mEq IV. The patient was becoming agitated so I did give her half milligram of Ativan. The patient's care will be signed out to Dr. Lenard Lance will follow-up the results of the urinalysis and disposition the patient.  ____________________________________________   FINAL CLINICAL IMPRESSION(S) / ED DIAGNOSES  Final diagnoses:  Fall, initial encounter  Contusion      NEW MEDICATIONS STARTED DURING THIS VISIT:  New Prescriptions   No medications on file  Note:  This document was prepared using Dragon voice recognition software and may include unintentional dictation errors.    Rebecka ApleyAllison P Avis Tirone, MD 06/03/16 [phone]

## 2016-06-03 NOTE — Discharge Instructions (Signed)
Please take your potassium supplementation as prescribed. Please follow-up with your doctor this week for recheck of your labs including her potassium level. Return to the emergency department for any personally concerning symptoms to yourself or staff.

## 2016-06-03 NOTE — ED Triage Notes (Addendum)
[facility]. Patient arrives tonight moaning in pain.  [facility] is [facility] ambulatory but was found in bed tonight moaning with pain.  Patient has yellowed bruise on left side of head.  Patient was treated here on Aug 12 for fall.

## 2016-06-03 NOTE — ED Notes (Signed)
Patient will not keep BP cuff or Pulse Ox on, she repeatedly rips them off.

## 2016-06-03 NOTE — ED Notes (Signed)
Patient stated she had to use bedpan but when I went to give it to her she yelled at her caregiver that she did not need to use it.

## 2016-06-03 NOTE — Consult Note (Signed)
CT head and cspine performed with limited prelim on scanner due to network failure.  Mandatory follow up with final report.   No evidence of acute intracranial injury or cervical spine fx.  JWatts MD

## 2016-06-03 NOTE — ED Notes (Signed)
Patient becoming increasingly combative to where I had to have EDT hold patient while I gave pain medicine.  I am also asking for Ativan to help patient with anxiety.

## 2016-06-03 NOTE — ED Notes (Addendum)
Patient is unable to follow simple commands, she says she does not understand what I am saying, that I am talking too fast.  She is talking nonsensical with random words and sentences having no point.  Patient does appear to be in pain moaning frequently.

## 2016-06-03 NOTE — ED Notes (Signed)
Pt will not keep EKG leads and BP cuff in place.  Family at bedside state pt is at her baseline mental status.  Pt continues to pull at lines and clothes, and trying to sit up in bed.  In and out catheter completed for urine sample and bladder drained as well.

## 2016-06-03 NOTE — ED Provider Notes (Signed)
-----------------------------------------   9:23 AM on 06/03/2016 -----------------------------------------  Patient care assumed from Dr. Zenda AlpersWebster. Urinalysis shows no concerning findings. We'll discharge the patient on 5 days of potassium supplementation, have her follow up with her primary care doctor for recheck this week. Dr. Zenda AlpersWebster had obtained verbal reports for the CT scan showing no acute injury. Overall the patient appears well. We'll discharge home.   Minna AntisKevin Karina Nofsinger, MD 06/03/16 [phone]

## 2016-08-06 ENCOUNTER — Emergency Department: Payer: Medicare Other

## 2016-08-06 ENCOUNTER — Encounter: Payer: Self-pay | Admitting: *Deleted

## 2016-08-06 ENCOUNTER — Emergency Department
Admission: EM | Admit: 2016-08-06 | Discharge: 2016-08-06 | Disposition: A | Discharge status: Home / Self Care | Payer: Medicare Other | Attending: Emergency Medicine | Admitting: Emergency Medicine

## 2016-08-06 DIAGNOSIS — Y999 Unspecified external cause status: Secondary | ICD-10-CM | POA: Insufficient documentation

## 2016-08-06 DIAGNOSIS — I10 Essential (primary) hypertension: Secondary | ICD-10-CM | POA: Insufficient documentation

## 2016-08-06 DIAGNOSIS — S0992XA Unspecified injury of nose, initial encounter: Secondary | ICD-10-CM | POA: Diagnosis present

## 2016-08-06 DIAGNOSIS — Z79899 Other long term (current) drug therapy: Secondary | ICD-10-CM | POA: Diagnosis not present

## 2016-08-06 DIAGNOSIS — I2510 Atherosclerotic heart disease of native coronary artery without angina pectoris: Secondary | ICD-10-CM | POA: Insufficient documentation

## 2016-08-06 DIAGNOSIS — S0031XA Abrasion of nose, initial encounter: Secondary | ICD-10-CM | POA: Insufficient documentation

## 2016-08-06 DIAGNOSIS — R51 Headache: Secondary | ICD-10-CM | POA: Diagnosis not present

## 2016-08-06 DIAGNOSIS — E039 Hypothyroidism, unspecified: Secondary | ICD-10-CM | POA: Diagnosis not present

## 2016-08-06 DIAGNOSIS — Y929 Unspecified place or not applicable: Secondary | ICD-10-CM | POA: Insufficient documentation

## 2016-08-06 DIAGNOSIS — W19XXXA Unspecified fall, initial encounter: Secondary | ICD-10-CM | POA: Diagnosis not present

## 2016-08-06 DIAGNOSIS — Y939 Activity, unspecified: Secondary | ICD-10-CM | POA: Insufficient documentation

## 2016-08-06 MED ORDER — TRAMADOL HCL 50 MG PO TABS
50.0000 mg | ORAL_TABLET | Freq: Once | ORAL | Status: AC
  Administered 2016-08-06: 50 mg via ORAL
  Filled 2016-08-06: qty 1

## 2016-08-06 NOTE — ED Triage Notes (Signed)
Pt arrives via EMS from [facility] ([address]). Pt had unwitnessed fall from the standing position. Lac to the left nose area and middle finger of the right hand. C/o head pain. hx of dementia- at baseline mental status. Pt was hypertensive enroute.

## 2016-08-06 NOTE — ED Provider Notes (Signed)
[facility] Emergency Department Provider Note   ____________________________________________   First MD Initiated Contact with Patient 08/06/16 0532     (approximate)  I have reviewed the triage vital signs and the nursing notes.   HISTORY  Chief Complaint Fall  History Limited due to patient's dementia  HPI Wanda Mitchell is a 82 y.o. female who comes into the hospital today with an unwitnessed fall.The patient had some head pain and is bleeding from the left side of her nose. The patient is unsure exactly why she is here. According to the patient's son she's had multiple falls in the last few weeks and she typically is supposed to walk with her walker. The patient did not have her walker this evening.    Past Medical History:  Diagnosis Date  . Anemia   . Anxiety   . CAD (coronary artery disease)   . Hypertension   . Hypokalemia   . Hypothyroid   . Urgency incontinence   . UTI (urinary tract infection)     There are no active problems to display for this patient.   History reviewed. No pertinent surgical history.  Prior to Admission medications   Medication Sig Start Date End Date Taking? Authorizing Provider  acetaminophen (TYLENOL) 325 MG tablet Take 325 mg by mouth every 4 (four) hours as needed for mild pain, fever or headache.    Historical Provider, MD  cephALEXin (KEFLEX) 500 MG capsule Take 1 capsule (500 mg total) by mouth 3 (three) times daily. 05/27/16   Minna Antis, MD  Cholecalciferol 1000 UNITS tablet Take 1,000 Units by mouth every morning.    Historical Provider, MD  divalproex (DEPAKOTE SPRINKLE) 125 MG capsule Take 250 mg by mouth 2 (two) times daily.    Historical Provider, MD  docusate sodium (COLACE) 100 MG capsule Take 100 mg by mouth 2 (two) times daily.    Historical Provider, MD  furosemide (LASIX) 20 MG tablet Take 20 mg by mouth daily.    Historical Provider, MD  hydrocortisone 2.5 % cream Apply 1 application  topically 2 (two) times daily.    Historical Provider, MD  menthol-cetylpyridinium (CEPACOL) 3 MG lozenge Take 1 lozenge by mouth as needed for sore throat.    Historical Provider, MD  Multiple Vitamins-Minerals (MULTIVITAMIN ADULT PO) Take 1 tablet by mouth daily.    Historical Provider, MD  potassium chloride (K-DUR) 10 MEQ tablet Take 1 tablet (10 mEq total) by mouth 3 (three) times daily. 06/03/16   Minna Antis, MD  QUEtiapine (SEROQUEL) 25 MG tablet Take 12.5 mg by mouth 2 (two) times daily.    Historical Provider, MD    Allergies Review of patient's allergies indicates no known allergies.  No family history on file.  Social History Social History  Substance Use Topics  . Smoking status: Never Smoker  . Smokeless tobacco: Never Used  . Alcohol use No    Review of Systems  Unable to assess due to patient dementia  ____________________________________________   PHYSICAL EXAM:  VITAL SIGNS: ED Triage Vitals History limited by patient's dementia   Enc Vitals Group     BP (!) 121/93     Pulse Rate (!) 57     Resp 16     Temp 98 F (36.7 C)     Temp Source Oral     SpO2 99 %     Weight      Height      Head Circumference  Peak Flow      Pain Score      Pain Loc      Pain Edu?      Excl. in GC?     Constitutional: Alert and Not oriented. Well appearing and in mild distress. Eyes: Conjunctivae are normal. PERRL. EOMI. Head: Atraumatic. Nose: Bleeding to left side of nose but no septal hematoma and no bleeding coming from the bilateral not Scholls. Mouth/Throat: Mucous membranes are moist.  Oropharynx non-erythematous. Neck: No cervical spine tenderness to palpation. Cardiovascular: Normal rate, regular rhythm. Grossly normal heart sounds.  Good peripheral circulation. Respiratory: Normal respiratory effort.  No retractions. Lungs CTAB. Gastrointestinal: Soft and nontender. No distention. Musculoskeletal: No lower extremity tenderness nor edema.    Neurologic:  Normal speech and language. Skin:  Skin is warm, dry and intact.  Psychiatric: Mood and affect are normal.   ____________________________________________   LABS (all labs ordered are listed, but only abnormal results are displayed)  Labs Reviewed - No data to display ____________________________________________  EKG  none ____________________________________________  RADIOLOGY  CT head Ct cervical spine ____________________________________________   PROCEDURES  Procedure(s) performed: None  Procedures  Critical Care performed: No  ____________________________________________   INITIAL IMPRESSION / ASSESSMENT AND PLAN / ED COURSE  Pertinent labs & imaging results that were available during my care of the patient were reviewed by me and considered in my medical decision making (see chart for details).  82-year-old female who comes into the hospital today with an unwitnessed fall. The patient has dementia and she is unsure exactly what's going on. The patient's son reports that she's had multiple falls. We will perform a CT scan of the patient's head and cervical spine and then reassess the patient once received the results. The patient has some bleeding to her left nose but there is nothing to suture.  Clinical Course  Value Comment By Time  CT Head Wo Contrast No acute intracranial hemorrhage.  No acute/ traumatic cervical spine pathology.   Rebecka ApleyAllison P Glenna Brunkow, MD 10/22 [phone]   The patient's CT scan is unremarkable. I will place a Band-Aid on the patient's nose and she will be discharged home.  ____________________________________________   FINAL CLINICAL IMPRESSION(S) / ED DIAGNOSES  Final diagnoses:  Fall, initial encounter  Abrasion of nose, initial encounter      NEW MEDICATIONS STARTED DURING THIS VISIT:  New Prescriptions   No medications on file     Note:  This document was prepared using Dragon voice recognition software and  may include unintentional dictation errors.    Rebecka ApleyAllison P Kary Sugrue, MD 08/06/16 [phone]

## 2016-08-06 NOTE — ED Notes (Signed)
Patient transported to CT 

## 2016-08-21 ENCOUNTER — Emergency Department
Admission: EM | Admit: 2016-08-21 | Discharge: 2016-08-21 | Disposition: A | Discharge status: Home / Self Care | Payer: Medicare Other | Attending: Emergency Medicine | Admitting: Emergency Medicine

## 2016-08-21 ENCOUNTER — Other Ambulatory Visit: Payer: Self-pay

## 2016-08-21 ENCOUNTER — Encounter: Payer: Self-pay | Admitting: Emergency Medicine

## 2016-08-21 ENCOUNTER — Emergency Department: Payer: Medicare Other

## 2016-08-21 DIAGNOSIS — Y939 Activity, unspecified: Secondary | ICD-10-CM | POA: Insufficient documentation

## 2016-08-21 DIAGNOSIS — Y929 Unspecified place or not applicable: Secondary | ICD-10-CM | POA: Insufficient documentation

## 2016-08-21 DIAGNOSIS — I10 Essential (primary) hypertension: Secondary | ICD-10-CM | POA: Insufficient documentation

## 2016-08-21 DIAGNOSIS — Z79899 Other long term (current) drug therapy: Secondary | ICD-10-CM | POA: Diagnosis not present

## 2016-08-21 DIAGNOSIS — E039 Hypothyroidism, unspecified: Secondary | ICD-10-CM | POA: Diagnosis not present

## 2016-08-21 DIAGNOSIS — Z23 Encounter for immunization: Secondary | ICD-10-CM | POA: Insufficient documentation

## 2016-08-21 DIAGNOSIS — Y999 Unspecified external cause status: Secondary | ICD-10-CM | POA: Insufficient documentation

## 2016-08-21 DIAGNOSIS — I2510 Atherosclerotic heart disease of native coronary artery without angina pectoris: Secondary | ICD-10-CM | POA: Insufficient documentation

## 2016-08-21 DIAGNOSIS — N179 Acute kidney failure, unspecified: Secondary | ICD-10-CM | POA: Insufficient documentation

## 2016-08-21 DIAGNOSIS — S0101XA Laceration without foreign body of scalp, initial encounter: Secondary | ICD-10-CM | POA: Diagnosis not present

## 2016-08-21 DIAGNOSIS — S0990XA Unspecified injury of head, initial encounter: Secondary | ICD-10-CM | POA: Diagnosis present

## 2016-08-21 DIAGNOSIS — W19XXXA Unspecified fall, initial encounter: Secondary | ICD-10-CM | POA: Diagnosis not present

## 2016-08-21 HISTORY — DX: Unspecified dementia, unspecified severity, without behavioral disturbance, psychotic disturbance, mood disturbance, and anxiety: F03.90

## 2016-08-21 LAB — COMPREHENSIVE METABOLIC PANEL
ALT: 13 U/L — ABNORMAL LOW (ref 14–54)
ANION GAP: 10 (ref 5–15)
AST: 32 U/L (ref 15–41)
Albumin: 3.6 g/dL (ref 3.5–5.0)
Alkaline Phosphatase: 71 U/L (ref 38–126)
BUN: 20 mg/dL (ref 6–20)
CHLORIDE: 103 mmol/L (ref 101–111)
CO2: 25 mmol/L (ref 22–32)
Calcium: 9.4 mg/dL (ref 8.9–10.3)
Creatinine, Ser: 1.02 mg/dL — ABNORMAL HIGH (ref 0.44–1.00)
GFR calc Af Amer: 54 mL/min — ABNORMAL LOW (ref >60)
GFR, EST NON AFRICAN AMERICAN: 46 mL/min — AB (ref >60)
Glucose, Bld: 97 mg/dL (ref 65–99)
POTASSIUM: 3.7 mmol/L (ref 3.5–5.1)
Sodium: 138 mmol/L (ref 135–145)
TOTAL PROTEIN: 6.5 g/dL (ref 6.5–8.1)
Total Bilirubin: 0.3 mg/dL (ref 0.3–1.2)

## 2016-08-21 LAB — CBC
HCT: 39.2 % (ref 35.0–47.0)
Hemoglobin: 13.2 g/dL (ref 12.0–16.0)
MCH: 30.1 pg (ref 26.0–34.0)
MCHC: 33.7 g/dL (ref 32.0–36.0)
MCV: 89.4 fL (ref 80.0–100.0)
PLATELETS: 177 10*3/uL (ref 150–440)
RBC: 4.38 MIL/uL (ref 3.80–5.20)
RDW: 13.9 % (ref 11.5–14.5)
WBC: 5.3 10*3/uL (ref 3.6–11.0)

## 2016-08-21 LAB — URINALYSIS COMPLETE WITH MICROSCOPIC (ARMC ONLY)
Bilirubin Urine: NEGATIVE
Glucose, UA: NEGATIVE mg/dL
HGB URINE DIPSTICK: NEGATIVE
KETONES UR: NEGATIVE mg/dL
Leukocytes, UA: NEGATIVE
NITRITE: NEGATIVE
PROTEIN: NEGATIVE mg/dL
SPECIFIC GRAVITY, URINE: 1.004 — AB (ref 1.005–1.030)
WBC UA: NONE SEEN WBC/hpf (ref 0–5)
pH: 8 (ref 5.0–8.0)

## 2016-08-21 LAB — BASIC METABOLIC PANEL
ANION GAP: 8 (ref 5–15)
BUN: 18 mg/dL (ref 6–20)
CO2: 26 mmol/L (ref 22–32)
Calcium: 8.9 mg/dL (ref 8.9–10.3)
Chloride: 107 mmol/L (ref 101–111)
Creatinine, Ser: 0.92 mg/dL (ref 0.44–1.00)
GFR calc Af Amer: >60 mL/min (ref >60)
GFR calc non Af Amer: 52 mL/min — ABNORMAL LOW (ref >60)
GLUCOSE: 101 mg/dL — AB (ref 65–99)
POTASSIUM: 3.1 mmol/L — AB (ref 3.5–5.1)
Sodium: 141 mmol/L (ref 135–145)

## 2016-08-21 LAB — TROPONIN I

## 2016-08-21 MED ORDER — SODIUM CHLORIDE 0.9 % IV BOLUS (SEPSIS)
1000.0000 mL | Freq: Once | INTRAVENOUS | Status: AC
  Administered 2016-08-21: 1000 mL via INTRAVENOUS

## 2016-08-21 MED ORDER — ACETAMINOPHEN 500 MG PO TABS
1000.0000 mg | ORAL_TABLET | Freq: Once | ORAL | Status: AC
  Administered 2016-08-21: 1000 mg via ORAL
  Filled 2016-08-21: qty 2

## 2016-08-21 MED ORDER — TETANUS-DIPHTH-ACELL PERTUSSIS 5-2.5-18.5 LF-MCG/0.5 IM SUSP
0.5000 mL | Freq: Once | INTRAMUSCULAR | Status: AC
  Administered 2016-08-21: 0.5 mL via INTRAMUSCULAR
  Filled 2016-08-21: qty 0.5

## 2016-08-21 NOTE — ED Triage Notes (Signed)
[facility]. Pt found sitting on the floor with abrasion on right forehead. Bleeding controlled. Hx of dementia.

## 2016-08-21 NOTE — Discharge Instructions (Signed)
Keep laceration dry and clean. Wash with warm water and soap. Apply topical bacitracin. Protect from the sun to minimize scarring. Cover it with SPF 70 or higher and use hat when out in the sun for 6-9 months. You received 1 staple that must be removed in 7 days.  Watch for signs of infection: pus, redness of the skin surrounding it, or fever. If these develop see your doctor or return to the ER for antibiotics.

## 2016-08-21 NOTE — ED Provider Notes (Signed)
[facility] Emergency Department Provider Note  ____________________________________________  Time seen: Approximately 7:18 PM  I have reviewed the triage vital signs and the nursing notes.   HISTORY  Chief Complaint Fall  Level 5 caveat:  Portions of the history and physical were unable to be obtained due to dementia   HPI Wanda Mitchell is a 82 y.o. female with a history of advanced dementia, multiple UTIs, CAD, hypertension, hypothyroidism who presents for evaluation after a unwitnessed fall. Patient was found sitting on the floor in her room with a laceration to the right forehead. Patient is screaming and moaning and unable to answer any questions which per EMS is her baseline.   Past Medical History:  Diagnosis Date  . Anemia   . Anxiety   . CAD (coronary artery disease)   . Dementia   . Hypertension   . Hypokalemia   . Hypothyroid   . Urgency incontinence   . UTI (urinary tract infection)     There are no active problems to display for this patient.   History reviewed. No pertinent surgical history.  Prior to Admission medications   Medication Sig Start Date End Date Taking? Authorizing Provider  acetaminophen (TYLENOL) 325 MG tablet Take 325 mg by mouth every 4 (four) hours as needed for mild pain, fever or headache.   Yes Historical Provider, MD  loperamide (IMODIUM) 2 MG capsule Take 2 mg by mouth as needed for diarrhea or loose stools.   Yes Historical Provider, MD  loratadine (CLARITIN) 10 MG tablet Take 10 mg by mouth daily.   Yes Historical Provider, MD  mirtazapine (REMERON) 7.5 MG tablet Take 7.5 mg by mouth at bedtime.   Yes Historical Provider, MD  potassium chloride SA (K-DUR,KLOR-CON) 20 MEQ tablet Take 60 mEq by mouth daily.   Yes Historical Provider, MD  traMADol (ULTRAM) 50 MG tablet Take 50 mg by mouth 2 (two) times daily.   Yes Historical Provider, MD  triamterene-hydrochlorothiazide (MAXZIDE-25) 37.5-25 MG tablet Take 1  tablet by mouth daily.   Yes Historical Provider, MD  cephALEXin (KEFLEX) 500 MG capsule Take 1 capsule (500 mg total) by mouth 3 (three) times daily. Patient not taking: Reported on 08/21/2016 05/27/16   Minna AntisKevin Paduchowski, MD  Cholecalciferol 1000 UNITS tablet Take 1,000 Units by mouth every morning.    Historical Provider, MD  divalproex (DEPAKOTE SPRINKLE) 125 MG capsule Take 250 mg by mouth 2 (two) times daily.    Historical Provider, MD  docusate sodium (COLACE) 100 MG capsule Take 100 mg by mouth 2 (two) times daily.    Historical Provider, MD  furosemide (LASIX) 20 MG tablet Take 20 mg by mouth daily.    Historical Provider, MD  hydrocortisone 2.5 % cream Apply 1 application topically 2 (two) times daily.    Historical Provider, MD  menthol-cetylpyridinium (CEPACOL) 3 MG lozenge Take 1 lozenge by mouth as needed for sore throat.    Historical Provider, MD  Multiple Vitamins-Minerals (MULTIVITAMIN ADULT PO) Take 1 tablet by mouth daily.    Historical Provider, MD  potassium chloride (K-DUR) 10 MEQ tablet Take 1 tablet (10 mEq total) by mouth 3 (three) times daily. Patient not taking: Reported on 08/21/2016 06/03/16   Minna AntisKevin Paduchowski, MD  QUEtiapine (SEROQUEL) 25 MG tablet Take 12.5 mg by mouth 2 (two) times daily.    Historical Provider, MD    Allergies Patient has no known allergies.  History reviewed. No pertinent family history.  Social History Social History  Substance Use Topics  . Smoking status: Never Smoker  . Smokeless tobacco: Never Used  . Alcohol use No    Review of Systems Constitutional: Negative for fever. ENT: + facial injury. No neck injury Cardiovascular: Negative for chest injury. Respiratory: Negative for chest wall injury. Gastrointestinal: Negative for abdominal pain or injury. Musculoskeletal: Negative for back injury, negative for arm or leg pain. Skin: + R forehead laceration  Unable to obtain remaining ROS due to  dementia ____________________________________________   PHYSICAL EXAM:  VITAL SIGNS: ED Triage Vitals [08/21/16 1910]  Enc Vitals Group     BP 118/76     Pulse Rate 70     Resp (!) 22     Temp 98.5 F (36.9 C)     Temp Source Axillary     SpO2 98 %     Weight      Height      Head Circumference      Peak Flow      Pain Score      Pain Loc      Pain Edu?      Excl. in GC?     Constitutional: Alert and oriented. No acute distress. Does not appear intoxicated. HEENT Head: Normocephalic and atraumatic. Face: No facial bony tenderness. Stable midface Ears: No hemotympanum bilaterally. No Battle sign Eyes: No eye injury. PERRL. No raccoon eyes Nose: Nontender. No epistaxis. No rhinorrhea Mouth/Throat: Mucous membranes are moist. No oropharyngeal blood. No dental injury. Airway patent without stridor. Normal voice. Neck: no C-collar in place. No midline c-spine tenderness.  Cardiovascular: Normal rate, regular rhythm. Normal and symmetric distal pulses are present in all extremities. Pulmonary/Chest: Chest wall is stable and nontender to palpation/compression. Normal respiratory effort. Breath sounds are normal. No crepitus.  Abdominal: Soft, nontender, non distended. Musculoskeletal: Nontender with normal full range of motion in all extremities. No deformities. No thoracic or lumbar midline spinal tenderness. Pelvis is stable. Skin: 1 cm R forehead laceration Neurological: Moaning and screaming. Moves all extremities to command. Face is symmetric  Glascow Coma Score: 4 - Opens eyes on own 5 - Pushes away noxious stimulus 4 - Seems confused, disoriented GCS: 13   ____________________________________________   LABS (all labs ordered are listed, but only abnormal results are displayed)  Labs Reviewed  URINALYSIS COMPLETEWITH MICROSCOPIC ([facility] ONLY) - Abnormal; Notable for the following:       Result Value   Color, Urine COLORLESS (*)    APPearance CLEAR (*)     Specific Gravity, Urine 1.004 (*)    Bacteria, UA RARE (*)    Squamous Epithelial / LPF 0-5 (*)    All other components within normal limits  COMPREHENSIVE METABOLIC PANEL - Abnormal; Notable for the following:    Creatinine, Ser 1.02 (*)    ALT 13 (*)    GFR calc non Af Amer 46 (*)    GFR calc Af Amer 54 (*)    All other components within normal limits  BASIC METABOLIC PANEL - Abnormal; Notable for the following:    Potassium 3.1 (*)    Glucose, Bld 101 (*)    GFR calc non Af Amer 52 (*)    All other components within normal limits  URINE CULTURE  CBC  TROPONIN I   ____________________________________________  EKG  ED ECG REPORT I, Nita Sickle, the attending physician, personally viewed and interpreted this ECG.  Normal sinus rhythm, rate of 62, normal intervals, left axis deviation, no ST elevations or depressions.  ____________________________________________  RADIOLOGY  CT head and cervical: NO acute findigns ____________________________________________   PROCEDURES  Procedure(s) performed:yes .Marland Kitchen.Laceration Repair Date/Time: 08/21/2016 9:22 PM Performed by: Nita SickleVERONESE, Floris Authorized by: Nita SickleVERONESE, [place]   Consent:    Consent obtained:  Verbal   Consent given by: son.   Risks discussed:  Infection and pain Anesthesia (see MAR for exact dosages):    Anesthesia method:  None Laceration details:    Location:  Scalp   Scalp location:  R parietal   Length (cm):  0.5 Repair type:    Repair type:  Simple Pre-procedure details:    Preparation:  Patient was prepped and draped in usual sterile fashion Exploration:    Hemostasis achieved with:  Direct pressure   Wound extent: no foreign bodies/material noted     Contaminated: no   Treatment:    Area cleansed with:  Saline   Irrigation method:  Pressure wash   Visualized foreign bodies/material removed: no   Skin repair:    Repair method:  Staples   Number of staples:  1 Approximation:     Approximation:  Close   Vermilion border: well-aligned   Post-procedure details:    Dressing:  Open (no dressing)   Patient tolerance of procedure:  Tolerated well, no immediate complications   Critical Care performed:  None ____________________________________________   INITIAL IMPRESSION / ASSESSMENT AND PLAN / ED COURSE  82 y.o. female with a history of advanced dementia, multiple UTIs, CAD, hypertension, hypothyroidism who presents for evaluation after a unwitnessed fall with a small R forehead laceration. No signs or symptoms of basilar skull fracture. Patient is moving all of her extremities, face symmetric. No other injuries noticed on exam. We'll pursue CT head and neck, we'll repair laceration and give her tetanus booster, will check blood work and urine, EKG and troponin. We'll watch patient for cardiac monitor.  Clinical [facility] as of Aug 22 2331  [facility]  [facility]6 Labs showing mild AKI which improved with IVF. Patient with no other acute findings on labs, urine, and imaging. Will dc home  [CV]    Clinical Course User Index [CV] Nita Sicklearolina Yuliya Nova, MD   Patient with AKI. Will give IVF and recheck creatinine. UA pending. CT and all other labs WNL.  Pertinent labs & imaging results that were available during my care of the patient were reviewed by me and considered in my medical decision making (see chart for details).    ____________________________________________   FINAL CLINICAL IMPRESSION(S) / ED DIAGNOSES  Final diagnoses:  Fall, initial encounter  Laceration of scalp, initial encounter  AKI (acute kidney injury) (HCC)      NEW MEDICATIONS STARTED DURING THIS VISIT:  New Prescriptions   No medications on file     Note:  This document was prepared using Dragon voice recognition software and may include unintentional dictation errors.    Nita Sicklearolina Claryce Friel, MD 08/21/16 [phone]

## 2016-08-23 LAB — URINE CULTURE: Culture: NO GROWTH

## 2017-01-22 ENCOUNTER — Emergency Department: Payer: Medicare Other

## 2017-01-22 ENCOUNTER — Emergency Department
Admission: EM | Admit: 2017-01-22 | Discharge: 2017-01-22 | Disposition: A | Discharge status: Home / Self Care | Payer: Medicare Other | Attending: Student in an Organized Health Care Education/Training Program | Admitting: Student in an Organized Health Care Education/Training Program

## 2017-01-22 DIAGNOSIS — Y939 Activity, unspecified: Secondary | ICD-10-CM | POA: Insufficient documentation

## 2017-01-22 DIAGNOSIS — Y9289 Other specified places as the place of occurrence of the external cause: Secondary | ICD-10-CM | POA: Insufficient documentation

## 2017-01-22 DIAGNOSIS — W19XXXA Unspecified fall, initial encounter: Secondary | ICD-10-CM | POA: Diagnosis not present

## 2017-01-22 DIAGNOSIS — Z23 Encounter for immunization: Secondary | ICD-10-CM | POA: Insufficient documentation

## 2017-01-22 DIAGNOSIS — Z79899 Other long term (current) drug therapy: Secondary | ICD-10-CM | POA: Insufficient documentation

## 2017-01-22 DIAGNOSIS — S0181XA Laceration without foreign body of other part of head, initial encounter: Secondary | ICD-10-CM

## 2017-01-22 DIAGNOSIS — S0990XA Unspecified injury of head, initial encounter: Secondary | ICD-10-CM

## 2017-01-22 DIAGNOSIS — I10 Essential (primary) hypertension: Secondary | ICD-10-CM | POA: Insufficient documentation

## 2017-01-22 DIAGNOSIS — I2510 Atherosclerotic heart disease of native coronary artery without angina pectoris: Secondary | ICD-10-CM | POA: Insufficient documentation

## 2017-01-22 DIAGNOSIS — E039 Hypothyroidism, unspecified: Secondary | ICD-10-CM | POA: Insufficient documentation

## 2017-01-22 DIAGNOSIS — Y999 Unspecified external cause status: Secondary | ICD-10-CM | POA: Diagnosis not present

## 2017-01-22 MED ORDER — LIDOCAINE-EPINEPHRINE-TETRACAINE (LET) SOLUTION
3.0000 mL | Freq: Once | NASAL | Status: AC
  Administered 2017-01-22: 3 mL via TOPICAL

## 2017-01-22 MED ORDER — TETANUS-DIPHTH-ACELL PERTUSSIS 5-2.5-18.5 LF-MCG/0.5 IM SUSP
0.5000 mL | Freq: Once | INTRAMUSCULAR | Status: AC
  Administered 2017-01-22: 0.5 mL via INTRAMUSCULAR
  Filled 2017-01-22: qty 0.5

## 2017-01-22 MED ORDER — LIDOCAINE-EPINEPHRINE-TETRACAINE (LET) SOLUTION
3.0000 mL | Freq: Once | NASAL | Status: AC
  Administered 2017-01-22: 3 mL via TOPICAL
  Filled 2017-01-22: qty 3

## 2017-01-22 NOTE — ED Triage Notes (Signed)
She arrives via [facility] from  [place] with reports of a fall this afternoon  Pt has a laceration above her right eye upon arrival  unknown if she lost consciousness  Pt with a history of dementia

## 2017-01-22 NOTE — ED Provider Notes (Signed)
[facility] Emergency Department Provider Note    First MD Initiated Contact with Patient 01/22/17 1800     (approximate)  I have reviewed the triage vital signs and the nursing notes.   HISTORY  Chief Complaint Fall and Head Injury  Level V Caveat: Dementia:     HPI Wanda Mitchell is a 83 y.o. female with mechanical fall at [facility] this afternoon with head injury and laceration to the right forehead. Staff was uncertain if there was loss of consciousness. Patient has frequent falls because she refuses to use her walker and the same thing occurred today. She is not on any blood thinners.   Past Medical History:  Diagnosis Date  . Anemia   . Anxiety   . CAD (coronary artery disease)   . Dementia   . Hypertension   . Hypokalemia   . Hypothyroid   . Urgency incontinence   . UTI (urinary tract infection)    No family history on file. No past surgical history on file. There are no active problems to display for this patient.     Prior to Admission medications   Medication Sig Start Date End Date Taking? Authorizing Provider  acetaminophen (TYLENOL) 500 MG tablet Take 500 mg by mouth 3 (three) times daily.   Yes Historical Provider, MD  loperamide (IMODIUM) 2 MG capsule Take 2 mg by mouth as needed for diarrhea or loose stools.   Yes Historical Provider, MD  loratadine (CLARITIN) 10 MG tablet Take 10 mg by mouth daily.   Yes Historical Provider, MD  mirtazapine (REMERON) 7.5 MG tablet Take 7.5 mg by mouth at bedtime.   Yes Historical Provider, MD  potassium chloride SA (K-DUR,KLOR-CON) 20 MEQ tablet Take 40 mEq by mouth daily.    Yes Historical Provider, MD  traMADol (ULTRAM) 50 MG tablet Take 50 mg by mouth 2 (two) times daily.   Yes Historical Provider, MD  triamterene-hydrochlorothiazide (MAXZIDE-25) 37.5-25 MG tablet Take 1 tablet by mouth daily.   Yes Historical Provider, MD  cephALEXin (KEFLEX) 500 MG capsule Take 1 capsule (500 mg total)  by mouth 3 (three) times daily. Patient not taking: Reported on 08/21/2016 05/27/16   Minna Antis, MD  potassium chloride (K-DUR) 10 MEQ tablet Take 1 tablet (10 mEq total) by mouth 3 (three) times daily. Patient not taking: Reported on 08/21/2016 06/03/16   Minna Antis, MD    Allergies Patient has no known allergies.    Social History Social History  Substance Use Topics  . Smoking status: Never Smoker  . Smokeless tobacco: Never Used  . Alcohol use No    Review of Systems Patient denies headaches, rhinorrhea, blurry vision, numbness, shortness of breath, chest pain, edema, cough, abdominal pain, nausea, vomiting, diarrhea, dysuria, fevers, rashes or hallucinations unless otherwise stated above in HPI. ____________________________________________   PHYSICAL EXAM:  VITAL SIGNS: Vitals:   01/22/17 1747  BP: (!) 109/55  Pulse: 70  Resp: 12  Temp: 98.1 F (36.7 C)    Constitutional: Alert, disoriented, chronically ill appearing in no acute distress. Eyes: Conjunctivae are normal. PERRL. EOMI. Head: 1cm full thickness laceration to right forehead without galeal laceration Nose: No congestion/rhinnorhea. Mouth/Throat: Mucous membranes are moist.  Oropharynx non-erythematous. Neck: No stridor. Painless ROM. No cervical spine tenderness to palpation Hematological/Lymphatic/Immunilogical: No cervical lymphadenopathy. Cardiovascular: Normal rate, regular rhythm. Grossly normal heart sounds.  Good peripheral circulation. Respiratory: Normal respiratory effort.  No retractions. Lungs CTAB. Gastrointestinal: Soft and nontender. No distention. No abdominal bruits.  No CVA tenderness. Musculoskeletal: No lower extremity tenderness nor edema.  No joint effusions. Neurologic:  Normal speech , MAE spontaneously, no facial droop Skin:  Skin is warm, dry and intact. No rash noted.  ____________________________________________   LABS (all labs ordered are listed, but only  abnormal results are displayed)  No results found for this or any previous visit (from the past 24 hour(s)). ____________________________________________  EKG My review and personal interpretation at Time: 17:36   Indication: fall  Rate: 65  Rhythm: sinus Axis: left Other: normal intervals, no STEMI ____________________________________________  RADIOLOGY  I personally reviewed all radiographic images ordered to evaluate for the above acute complaints and reviewed radiology reports and findings.  These findings were personally discussed with the patient.  Please see medical record for radiology report.  ____________________________________________   PROCEDURES  Procedure(s) performed:  Marland KitchenMarland KitchenLaceration Repair Date/Time: 01/22/2017 7:01 PM Performed by: Willy Eddy Authorized by: Willy Eddy   Consent:    Consent obtained:  Verbal   Consent given by:  Healthcare agent   Risks discussed:  Pain and infection Anesthesia (see MAR for exact dosages):    Anesthesia method:  Local infiltration and topical application   Topical anesthetic:  LET   Local anesthetic:  Lidocaine 1% w/o epi Laceration details:    Location:  Face   Face location:  Forehead   Length (cm):  1 Repair type:    Repair type:  Simple Pre-procedure details:    Preparation:  Patient was prepped and draped in usual sterile fashion Treatment:    Area cleansed with:  Hibiclens   Amount of cleaning:  Standard Skin repair:    Repair method:  Sutures   Suture size:  5-0   Suture material:  Nylon   Suture technique:  Simple interrupted Approximation:    Approximation:  Close   Vermilion border: well-aligned   Post-procedure details:    Dressing:  Open (no dressing)      Critical Care performed: no ____________________________________________   INITIAL IMPRESSION / ASSESSMENT AND PLAN / ED COURSE  Pertinent labs & imaging results that were available during my care of the patient were reviewed  by me and considered in my medical decision making (see chart for details).  DDX: ich, laceration, contusion  Wanda Mitchell is a 83 y.o. who presents to the ED with 1 cm laceration on forehead s/p ffs. No distal loss of sensation of motor deficit. Denies any other injuries. Td udated. CT IMAGING OF THE HEAD AND NECK ORDERED TO EVALUATE FOR ACUTE TRAUMATIC INJURY AS FULL ASSESSMENT COULD NOT BE OBTAINED DUE TO HER UNDERLYING DEMENTIA. CT IMAGING WITHOUT ACUTE ABNORMALITY. VSS. Exam with distal NV intact. . No signs of erythema or surrounding infection. Plan lac repair.  Laceration was explored, irrigated, and repaired without complications. No FB in a bloodless field.   Discussed case with the patient's son who feels cold with patient go back to facility.       ____________________________________________   FINAL CLINICAL IMPRESSION(S) / ED DIAGNOSES  Final diagnoses:  Injury of head, initial encounter  Laceration of forehead, initial encounter      NEW MEDICATIONS STARTED DURING THIS VISIT:  New Prescriptions   No medications on file     Note:  This document was prepared using Dragon voice recognition software and may include unintentional dictation errors.    Willy Eddy, MD 01/22/17 2237

## 2017-01-22 NOTE — Discharge Instructions (Signed)
Have sutures removed in 7 days.

## 2017-01-22 NOTE — ED Notes (Signed)
Pt to CT now

## 2017-01-22 NOTE — ED Notes (Signed)
Son Sherilyn Cooter is sitting at bedside -  Update provided to him  Pt moaning from time to time  NAD assessed

## 2017-06-30 ENCOUNTER — Emergency Department
Admission: EM | Admit: 2017-06-30 | Discharge: 2017-06-30 | Disposition: A | Discharge status: Home / Self Care | Payer: Medicare Other | Attending: Emergency Medicine | Admitting: Emergency Medicine

## 2017-06-30 ENCOUNTER — Encounter: Payer: Self-pay | Admitting: Emergency Medicine

## 2017-06-30 DIAGNOSIS — E039 Hypothyroidism, unspecified: Secondary | ICD-10-CM | POA: Insufficient documentation

## 2017-06-30 DIAGNOSIS — E860 Dehydration: Secondary | ICD-10-CM | POA: Insufficient documentation

## 2017-06-30 DIAGNOSIS — I10 Essential (primary) hypertension: Secondary | ICD-10-CM | POA: Diagnosis not present

## 2017-06-30 DIAGNOSIS — I2510 Atherosclerotic heart disease of native coronary artery without angina pectoris: Secondary | ICD-10-CM | POA: Insufficient documentation

## 2017-06-30 DIAGNOSIS — R2231 Localized swelling, mass and lump, right upper limb: Secondary | ICD-10-CM | POA: Diagnosis present

## 2017-06-30 DIAGNOSIS — L03113 Cellulitis of right upper limb: Secondary | ICD-10-CM

## 2017-06-30 DIAGNOSIS — Z79899 Other long term (current) drug therapy: Secondary | ICD-10-CM | POA: Insufficient documentation

## 2017-06-30 DIAGNOSIS — N179 Acute kidney failure, unspecified: Secondary | ICD-10-CM | POA: Diagnosis not present

## 2017-06-30 LAB — CBC
HEMATOCRIT: 39.6 % (ref 35.0–47.0)
HEMOGLOBIN: 13.5 g/dL (ref 12.0–16.0)
MCH: 31.3 pg (ref 26.0–34.0)
MCHC: 34.1 g/dL (ref 32.0–36.0)
MCV: 91.7 fL (ref 80.0–100.0)
Platelets: 156 10*3/uL (ref 150–440)
RBC: 4.31 MIL/uL (ref 3.80–5.20)
RDW: 13.7 % (ref 11.5–14.5)
WBC: 4.7 10*3/uL (ref 3.6–11.0)

## 2017-06-30 LAB — BASIC METABOLIC PANEL
ANION GAP: 11 (ref 5–15)
BUN: 37 mg/dL — AB (ref 6–20)
CHLORIDE: 105 mmol/L (ref 101–111)
CO2: 26 mmol/L (ref 22–32)
Calcium: 9.2 mg/dL (ref 8.9–10.3)
Creatinine, Ser: 1.8 mg/dL — ABNORMAL HIGH (ref 0.44–1.00)
GFR calc Af Amer: 27 mL/min — ABNORMAL LOW (ref >60)
GFR, EST NON AFRICAN AMERICAN: 23 mL/min — AB (ref >60)
Glucose, Bld: 137 mg/dL — ABNORMAL HIGH (ref 65–99)
POTASSIUM: 3.6 mmol/L (ref 3.5–5.1)
SODIUM: 142 mmol/L (ref 135–145)

## 2017-06-30 MED ORDER — CEPHALEXIN 250 MG PO CAPS: 250.0000 mg | ORAL_CAPSULE | Freq: Two times a day (BID) | ORAL | 0 refills | Status: AC

## 2017-06-30 MED ORDER — CEPHALEXIN 250 MG PO CAPS
250.0000 mg | ORAL_CAPSULE | Freq: Once | ORAL | Status: AC
  Administered 2017-06-30: 250 mg via ORAL
  Filled 2017-06-30: qty 1

## 2017-06-30 MED ORDER — SODIUM CHLORIDE 0.9 % IV BOLUS (SEPSIS)
500.0000 mL | Freq: Once | INTRAVENOUS | Status: AC
  Administered 2017-06-30: 500 mL via INTRAVENOUS

## 2017-06-30 MED ORDER — SODIUM CHLORIDE 0.9 % IV BOLUS (SEPSIS): 500.0000 mL | Freq: Once | INTRAVENOUS | Status: DC

## 2017-06-30 NOTE — ED Triage Notes (Signed)
Pt in via [facility] from [facility]; per EMS, pt found down in sunroom, appeared to have unwitnessed fall, unknown down time.  Pt alert, disoriented x 4.  EMS reports BP 76/45; BP WDL at this time.

## 2017-06-30 NOTE — ED Notes (Signed)
Pt family (Son) st's pt is deaf and in her 14th year of Alzhimers. This EDT told family I would document that in the chart

## 2017-06-30 NOTE — ED Notes (Signed)
Pt son and healthcare POA is at bedside, wishes to take pt back to [place] post IVF.  ED MD aware.

## 2017-06-30 NOTE — Discharge Instructions (Signed)
°  Please follow up with your mom's doctor at [facility] in 1-3 days for recheck of her hand a labs to recheck her 'kidney function.'  Call your mom's doctor sooner or return to the ED if she is to develop worsening signs of infection such as: increased redness, increased pain, pus, fever, increased weakness, confusion (beyong her normal) or other symptoms that concern you.

## 2017-06-30 NOTE — ED Notes (Signed)
Pt is high fall risk; bed alarm placed on pt at this time, fall mats placed in the floor along each side of stretcher.

## 2017-06-30 NOTE — ED Notes (Addendum)
Epad not working at time of discharge, hard copy signed by pt's son and healthcare POA and placed in chart.  Pt transported back to [facility] at this time via son, Marinda Elk.

## 2017-06-30 NOTE — ED Provider Notes (Signed)
[facility] Emergency Department Provider Note   ____________________________________________   First MD Initiated Contact with Patient 06/30/17 2012     (approximate)  I have reviewed the triage vital signs and the nursing notes.   HISTORY  Chief Complaint Fall  EM caveat: Unwitnessed fall and patient unable to recall due to severe dementia  HPI Wanda Mitchell is a 83 y.o. female with severe advanced dementia.history of urinary tract infections and frequent falls.  son reports he was called and told the patient was found sitting on the ground. They called 911 at her facility because they're concerned she could've fallen but no one witnessed it. He has not noticed any signs of injury, but the last day they noticed that her right hand seem slightly bruised. He reports she had an x-ray of it today and was told this was normal. He has not noticed any recent illness except she had a urinary tract infection about a month ago which was improved and treated. The son reports that he would not want his mother to receive a CAT scan, he reports that he sees no signs of head injury. Does not wish for reevaluation of the right hand with an x-ray as this was done earlier today and he was told was normal.  He reports she is acting normally. She is disoriented, often doesn't recognize him.she seems to be at her baseline and he reports she does not appear to have any pain or discomfort. Son reports that his primary goal of care for her at this time is keeping her comfortable and making sure she is not in pain. He does not wish for any advanced interventions or anything that would cause her discomfort   Past Medical History:  Diagnosis Date  . Anemia   . Anxiety   . CAD (coronary artery disease)   . Dementia   . Hypertension   . Hypokalemia   . Hypothyroid   . Urgency incontinence   . UTI (urinary tract infection)     There are no active problems to display for this  patient.   History reviewed. No pertinent surgical history.  Prior to Admission medications   Medication Sig Start Date End Date Taking? Authorizing Provider  acetaminophen (TYLENOL) 500 MG tablet Take 500 mg by mouth 3 (three) times daily.    [provider]  cephALEXin (KEFLEX) 250 MG capsule Take 1 capsule (250 mg total) by mouth 2 (two) times daily. 06/30/17 07/10/17  Sharyn Creamer, MD  loperamide (IMODIUM) 2 MG capsule Take 2 mg by mouth as needed for diarrhea or loose stools.    [provider]  loratadine (CLARITIN) 10 MG tablet Take 10 mg by mouth daily.    [provider]  mirtazapine (REMERON) 7.5 MG tablet Take 7.5 mg by mouth at bedtime.    [provider]  potassium chloride (K-DUR) 10 MEQ tablet Take 1 tablet (10 mEq total) by mouth 3 (three) times daily. Patient not taking: Reported on 08/21/2016 06/03/16   Minna Antis, MD  potassium chloride SA (K-DUR,KLOR-CON) 20 MEQ tablet Take 40 mEq by mouth daily.     [provider]  traMADol (ULTRAM) 50 MG tablet Take 50 mg by mouth 2 (two) times daily.    [provider]  triamterene-hydrochlorothiazide (MAXZIDE-25) 37.5-25 MG tablet Take 1 tablet by mouth daily.    [provider]    Allergies Patient has no known allergies.  No family history on file.  Social History Social  History  Substance Use Topics  . Smoking status: Never Smoker  . Smokeless tobacco: Never Used  . Alcohol use No    Review of Systems EM caveat  ____________________________________________   PHYSICAL EXAM:  VITAL SIGNS: ED Triage Vitals  Enc Vitals Group     BP 06/30/17 1932 (!) 137/58     Pulse Rate 06/30/17 1945 63     Resp 06/30/17 1932 (!) 22     Temp 06/30/17 1932 97.8 F (36.6 C)     Temp Source 06/30/17 1932 Oral     SpO2 06/30/17 1945 93 %     Weight 06/30/17 1933 110 lb (49.9 kg)     Height 06/30/17 1933  (1.575 m)     Head Circumference --      Peak Flow --       Pain Score --      Pain Loc --      Pain Edu? --      Excl. in GC? --     Constitutional: Alert and disoriented. Well appearing and in no acute distress.resting comfortably. Pleasant. No agitation. Eyes: Conjunctivae are normal. Head: Atraumatic. Nose: No congestion/rhinnorhea. Mouth/Throat: Mucous membranes are moist. Neck: No stridor.  no cervical thoracic or lumbar tenderness elicited. Cardiovascular: Normal rate, regular rhythm. Grossly normal heart sounds.  Good peripheral circulation. Respiratory: Normal respiratory effort.  No retractions. Lungs CTAB. Gastrointestinal: Soft and nontender. No distention. Musculoskeletal: No lower extremity tenderness nor edema. able to range his lower extremities well without any pain. No bruising or traumatic injury noted to the joints except for modest bruising over the right dorsal hand with extension of bruising and questionably some redness overlying the right middle finger and right dorsal hand over the knuckles. No significant joint effusion but significant arthritic changes noted. Normal capillary refill in all digits of the right hand. No edema. Question if there may be some mild cellulitis with an overlying bruise. No obvious joint effusions. Neurologic:  Normal speech and language. No gross focal neurologic deficits are appreciated.  Skin:  Skin is warm, dry and intact. No rash noted. Psychiatric: Mood and affect are normal. Speech and behavior are normal.  ____________________________________________   LABS (all labs ordered are listed, but only abnormal results are displayed)  Labs Reviewed  BASIC METABOLIC PANEL - Abnormal; Notable for the following:       Result Value   Glucose, Bld 137 (*)    BUN 37 (*)    Creatinine, Ser 1.80 (*)    GFR calc non Af Amer 23 (*)    GFR calc Af Amer 27 (*)    All other components within normal limits  CBC  URINALYSIS, COMPLETE (UACMP) WITH MICROSCOPIC  CBG MONITORING, ED    ____________________________________________  EKG  reviewed and interpreted by me at 1935 Heart rate 60 QRS 90 QTc 460 Normal sinus rhythm, Q waves noted anteriorly, appears to be unchanged from 01/22/2017 no evidence of acute ischemic change ____________________________________________  RADIOLOGY   ____________________________________________   PROCEDURES  Procedure(s) performed: None  Procedures  Critical Care performed: No  ____________________________________________   INITIAL IMPRESSION / ASSESSMENT AND PLAN / ED COURSE  Pertinent labs & imaging results that were available during my care of the patient were reviewed by me and considered in my medical decision making (see chart for details).  based upon health care power of attorneys discussion with myself, he wishes for his mother to only have basic lab work done with a goal to make  sure she is comfortable and not in pain. At present she does not appear to be in any. We discussed that the labs that were done indicates she may be dehydrated or have kidney injury, son does not wish for her to have anything beyond IV fluid at this time and to be discharged back to her memory care where she is most comfortable. He is agreeable with the plan to treat with Keflex in the event she does have a cellulitis or infection of the right hand, but he does not wish for reevaluation with an x-ray at this time and does not wish for her to have an in and out catheter to check for urinary infection at this time either. Cephalexin would likely treat for both cellulitis as well as possible UTI if she did have one. We're in agreement with treating with Keflex, discharging after IV fluids and son notes still have doctors making house calls reevaluate her in a couple of days' time.  We specifically discussed obtaining imaging including head CT after reported unwitnessed fall. Also discussed imaging of the neck. Patient's son reports he would not  wish for any intervention if an abnormality was found, he reports that her present: Care is to make sure she is comfortable. He does not think she is in any pain and I would agree. He wishes to be able to take her home himself, and continued care for her with outpatient follow-up. I think this is very reasonable. He understands that she is likely dehydrated and her kidneys are "working at 50% of normal". He is agreeable with this and was offered admission for rehydration and a kidney injury, but opted to take her home for her own comfort. I think this is very reasonable.  Return precautions and monitoring of the right hand lesion carefully discussed with son who is in agreement.      ____________________________________________   FINAL CLINICAL IMPRESSION(S) / ED DIAGNOSES  Final diagnoses:  AKI (acute kidney injury) (HCC)  Dehydration  Cellulitis of right hand      NEW MEDICATIONS STARTED DURING THIS VISIT:  Discharge Medication List as of 06/30/2017  8:23 PM       Note:  This document was prepared using Dragon voice recognition software and may include unintentional dictation errors.     Sharyn Creamer, MD 06/30/17 2148

## 2017-07-14 ENCOUNTER — Emergency Department
Admission: EM | Admit: 2017-07-14 | Discharge: 2017-07-14 | Disposition: A | Discharge status: Home / Self Care | Payer: Medicare Other | Attending: Emergency Medicine | Admitting: Emergency Medicine

## 2017-07-14 ENCOUNTER — Encounter: Payer: Self-pay | Admitting: *Deleted

## 2017-07-14 ENCOUNTER — Emergency Department: Payer: Medicare Other

## 2017-07-14 DIAGNOSIS — Y92129 Unspecified place in nursing home as the place of occurrence of the external cause: Secondary | ICD-10-CM | POA: Insufficient documentation

## 2017-07-14 DIAGNOSIS — Y939 Activity, unspecified: Secondary | ICD-10-CM | POA: Diagnosis not present

## 2017-07-14 DIAGNOSIS — Z23 Encounter for immunization: Secondary | ICD-10-CM | POA: Diagnosis not present

## 2017-07-14 DIAGNOSIS — E039 Hypothyroidism, unspecified: Secondary | ICD-10-CM | POA: Insufficient documentation

## 2017-07-14 DIAGNOSIS — Z79899 Other long term (current) drug therapy: Secondary | ICD-10-CM | POA: Insufficient documentation

## 2017-07-14 DIAGNOSIS — Y999 Unspecified external cause status: Secondary | ICD-10-CM | POA: Insufficient documentation

## 2017-07-14 DIAGNOSIS — S0101XA Laceration without foreign body of scalp, initial encounter: Secondary | ICD-10-CM

## 2017-07-14 DIAGNOSIS — W19XXXA Unspecified fall, initial encounter: Secondary | ICD-10-CM

## 2017-07-14 DIAGNOSIS — W1830XA Fall on same level, unspecified, initial encounter: Secondary | ICD-10-CM | POA: Insufficient documentation

## 2017-07-14 DIAGNOSIS — I10 Essential (primary) hypertension: Secondary | ICD-10-CM | POA: Diagnosis not present

## 2017-07-14 DIAGNOSIS — I2510 Atherosclerotic heart disease of native coronary artery without angina pectoris: Secondary | ICD-10-CM | POA: Diagnosis not present

## 2017-07-14 DIAGNOSIS — R4781 Slurred speech: Secondary | ICD-10-CM | POA: Diagnosis not present

## 2017-07-14 DIAGNOSIS — F0390 Unspecified dementia without behavioral disturbance: Secondary | ICD-10-CM | POA: Insufficient documentation

## 2017-07-14 DIAGNOSIS — S098XXA Other specified injuries of head, initial encounter: Secondary | ICD-10-CM | POA: Diagnosis present

## 2017-07-14 LAB — CBC WITH DIFFERENTIAL/PLATELET
Basophils Absolute: 0.1 10*3/uL (ref 0–0.1)
Basophils Relative: 1 %
EOS ABS: 0.2 10*3/uL (ref 0–0.7)
EOS PCT: 5 %
HCT: 41.9 % (ref 35.0–47.0)
Hemoglobin: 14.1 g/dL (ref 12.0–16.0)
LYMPHS ABS: 1.8 10*3/uL (ref 1.0–3.6)
Lymphocytes Relative: 34 %
MCH: 31.2 pg (ref 26.0–34.0)
MCHC: 33.7 g/dL (ref 32.0–36.0)
MCV: 92.7 fL (ref 80.0–100.0)
MONO ABS: 0.5 10*3/uL (ref 0.2–0.9)
MONOS PCT: 10 %
Neutro Abs: 2.6 10*3/uL (ref 1.4–6.5)
Neutrophils Relative %: 50 %
PLATELETS: 168 10*3/uL (ref 150–440)
RBC: 4.51 MIL/uL (ref 3.80–5.20)
RDW: 14 % (ref 11.5–14.5)
WBC: 5.2 10*3/uL (ref 3.6–11.0)

## 2017-07-14 LAB — BASIC METABOLIC PANEL
Anion gap: 9 (ref 5–15)
BUN: 34 mg/dL — ABNORMAL HIGH (ref 6–20)
CALCIUM: 9.5 mg/dL (ref 8.9–10.3)
CHLORIDE: 108 mmol/L (ref 101–111)
CO2: 28 mmol/L (ref 22–32)
CREATININE: 1.12 mg/dL — AB (ref 0.44–1.00)
GFR calc Af Amer: 48 mL/min — ABNORMAL LOW (ref >60)
GFR calc non Af Amer: 41 mL/min — ABNORMAL LOW (ref >60)
Glucose, Bld: 106 mg/dL — ABNORMAL HIGH (ref 65–99)
Potassium: 3.8 mmol/L (ref 3.5–5.1)
SODIUM: 145 mmol/L (ref 135–145)

## 2017-07-14 LAB — TROPONIN I

## 2017-07-14 MED ORDER — TETANUS-DIPHTH-ACELL PERTUSSIS 5-2.5-18.5 LF-MCG/0.5 IM SUSP
0.5000 mL | Freq: Once | INTRAMUSCULAR | Status: AC
  Administered 2017-07-14: 0.5 mL via INTRAMUSCULAR
  Filled 2017-07-14: qty 0.5

## 2017-07-14 NOTE — ED Notes (Signed)
Melissa at [facility] notified pt returning with son

## 2017-07-14 NOTE — ED Notes (Signed)
Bed alarm placed on pt, head wound cleaned and dryed

## 2017-07-14 NOTE — ED Provider Notes (Signed)
[facility] Emergency Department Provider Note   ____________________________________________   First MD Initiated Contact with Patient 07/14/17 1612     (approximate)  I have reviewed the triage vital signs and the nursing notes.   HISTORY  Chief Complaint Fall    HPI Wanda Mitchell is a 83 y.o. female Who came from [place] memory care with an unwitnessed fall when EMS arrived they reports she is awake and alert sitting in a chair. She has a history of fairly severe dementia. Reportedly at her baseline mental status is a cut on the back of her head.she has done this before. Initial vital signs show pulse of 49 but this was based on the reading from the pulse ox which did not have a good waveform I will evaluate this further with EKG and lab work just to be safe.   Past Medical History:  Diagnosis Date  . Anemia   . Anxiety   . CAD (coronary artery disease)   . Dementia   . Hypertension   . Hypokalemia   . Hypothyroid   . Urgency incontinence   . UTI (urinary tract infection)     There are no active problems to display for this patient.   History reviewed. No pertinent surgical history.  Prior to Admission medications   Medication Sig Start Date End Date Taking? Authorizing Provider  acetaminophen (TYLENOL) 500 MG tablet Take 500 mg by mouth 3 (three) times daily.    [provider]  loperamide (IMODIUM) 2 MG capsule Take 2 mg by mouth as needed for diarrhea or loose stools.    [provider]  loratadine (CLARITIN) 10 MG tablet Take 10 mg by mouth daily.    [provider]  mirtazapine (REMERON) 7.5 MG tablet Take 7.5 mg by mouth at bedtime.    [provider]  potassium chloride (K-DUR) 10 MEQ tablet Take 1 tablet (10 mEq total) by mouth 3 (three) times daily. Patient not taking: Reported on 08/21/2016 06/03/16   Minna Antis, MD  potassium chloride SA (K-DUR,KLOR-CON) 20 MEQ tablet Take 40 mEq by  mouth daily.     [provider]  traMADol (ULTRAM) 50 MG tablet Take 50 mg by mouth 2 (two) times daily.    [provider]  triamterene-hydrochlorothiazide (MAXZIDE-25) 37.5-25 MG tablet Take 1 tablet by mouth daily.    [provider]    Allergies Patient has no known allergies.  History reviewed. No pertinent family history.  Social History Social History  Substance Use Topics  . Smoking status: Never Smoker  . Smokeless tobacco: Never Used  . Alcohol use No    Review of Systems difficult to obtain this due to dementia but a near as I can tell Constitutional: No fever/chills Eyes: No visual changes. ENT: No sore throat. Cardiovascular: Denies chest pain. Respiratory: Denies shortness of breath. Gastrointestinal: No abdominal pain.  No nausea, no vomiting.  No diarrhea.  No constipation. Genitourinary: Negative for dysuria. Musculoskeletal: Negative for back pain. Skin: Negative for rash. Neurological: Negative for headaches, focal weakness  ____________________________________________   PHYSICAL EXAM:  VITAL SIGNS: ED Triage Vitals  Enc Vitals Group     BP 07/14/17 1600 (!) 138/53     Pulse Rate 07/14/17 1600 68     Resp 07/14/17 1601 16     Temp 07/14/17 1601 98.3 F (36.8 C)     Temp Source 07/14/17 1601 Oral     SpO2 07/14/17 1600 95 %  Weight 07/14/17 1601 110 lb (49.9 kg)     Height 07/14/17 1601  (1.575 m)     Head Circumference --      Peak Flow --      Pain Score --      Pain Loc --      Pain Edu? --      Excl. in GC? --     Constitutional: Alert  Well appearing and in no acute distress. Eyes: Conjunctivae are normal. Head: Atraumaticexcept for an abrasion which has some blood on it on the occiput and do not actually see a laceration there. Nose: No congestion/rhinnorhea. Mouth/Throat: Mucous membranes are moist.  Oropharynx non-erythematous. Neck: No stridor.   Cardiovascular: Normal rate, regular rhythm.  Grossly normal heart sounds.  Good peripheral circulation. Respiratory: Normal respiratory effort.  No retractions. Lungs CTAB. Gastrointestinal: Soft and nontender. No distention. No abdominal bruits. No CVA tenderness. Musculoskeletal: No lower extremity tenderness nor edema.  No joint effusions. Neurologic:  Normal speech and language. No gross focal neurologic deficits are appreciated. patient moving all extremities equally and well Skin:  Skin is warm, dry and intact. No rash noted.   ____________________________________________   LABS (all labs ordered are listed, but only abnormal results are displayed)  Labs Reviewed  BASIC METABOLIC PANEL - Abnormal; Notable for the following:       Result Value   Glucose, Bld 106 (*)    BUN 34 (*)    Creatinine, Ser 1.12 (*)    GFR calc non Af Amer 41 (*)    GFR calc Af Amer 48 (*)    All other components within normal limits  CBC WITH DIFFERENTIAL/PLATELET  TROPONIN I   ____________________________________________  EKG  EKG read and interpreted by me shows normal sinus rhythm rate of 68 left axis no acute ST-T wave changes a lot of baseline wandering the ____________________________________________  RADIOLOGY  chest x-ray read by radiology as no acute disease _CT of the head and neck showed no acute disease either___________________________________________   PROCEDURES  Procedure(s) performed: scalp was cleaned and examined there was nothing that needed to be glued were sewed  Procedures  Critical Care performed:   ____________________________________________   INITIAL IMPRESSION / ASSESSMENT AND PLAN / ED COURSE  Pertinent labs & imaging results that were available during my care of the patient were reviewed by me and considered in my medical decision making (see chart for details).  no corroboration of the incident of right cardiac noted only with the pulse ox. This is probably spurious. Patient is otherwise at  baseline his near as I can tell. I will discharge her      ____________________________________________   FINAL CLINICAL IMPRESSION(S) / ED DIAGNOSES  Final diagnoses:  Fall, initial encounter  Laceration of scalp, initial encounter      NEW MEDICATIONS STARTED DURING THIS VISIT:  New Prescriptions   No medications on file     Note:  This document was prepared using Dragon voice recognition software and may include unintentional dictation errors.    Arnaldo Natal, MD 07/14/17 [phone]

## 2017-07-14 NOTE — Discharge Instructions (Signed)
return for any further problems. monitor area on back of head to make sure that no infection happens

## 2017-07-14 NOTE — ED Triage Notes (Signed)
Pt arrives via EMS from [facility] unit, states a unwitnessed fall, upon EMS arrival pt awake and alert, siting in chair, hx of dementia, upon arrival pt awake, speaking in incomprehensible sentences, laceration to back of head

## 2017-07-14 NOTE — ED Notes (Signed)
Discharge reviewed with son, pt to be transported back to facility with son

## 2017-08-16 IMAGING — CT CT HEAD W/O CM
2 of 6 series · 11 of 47 positions shown, 13 images · non-contrast
Comparison: CT 02/22/2015

CLINICAL DATA: Post unwitnessed fall with head laceration and right
arm pain.

EXAM:
CT HEAD WITHOUT CONTRAST
CT CERVICAL SPINE WITHOUT CONTRAST
TECHNIQUE: Multidetector CT imaging of the head and cervical spine was
performed following the standard protocol without intravenous
contrast. Multiplanar CT image reconstructions of the cervical spine
were also generated.

[Series 11: coronal bone · coronal · 0.26mm/px · 3 of 50 slices shown]
[im 17/50  brain]
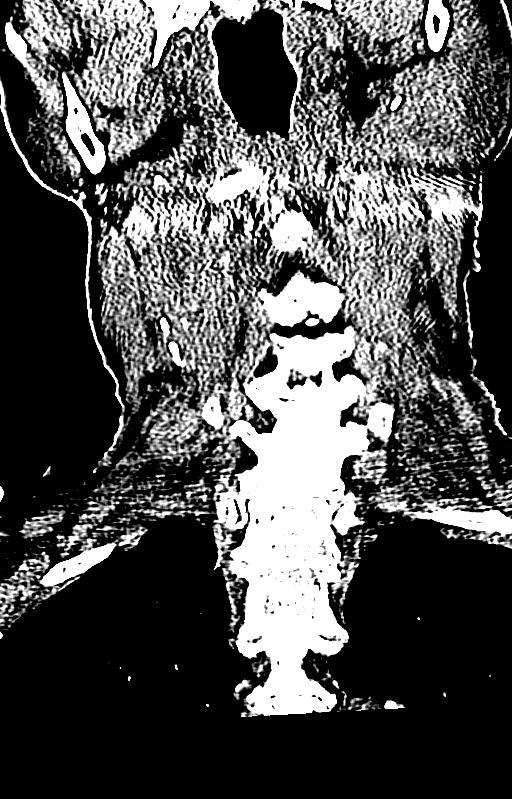
[im 22/50  brain]
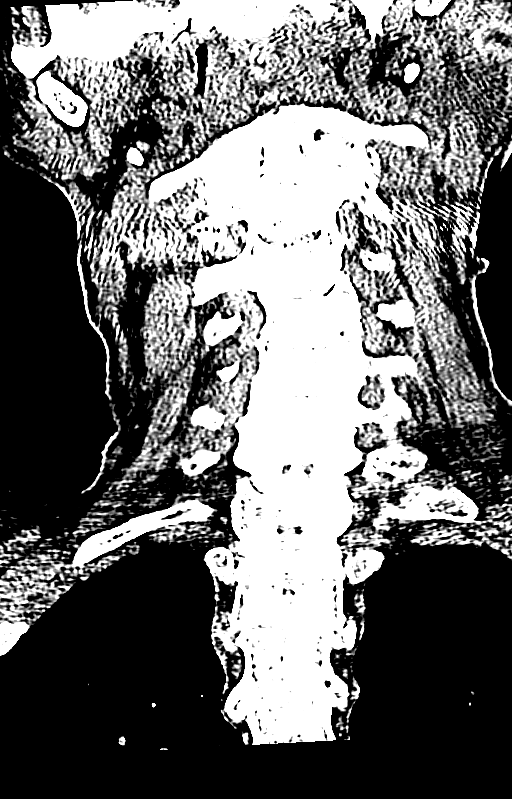
[im 28/50  brain]
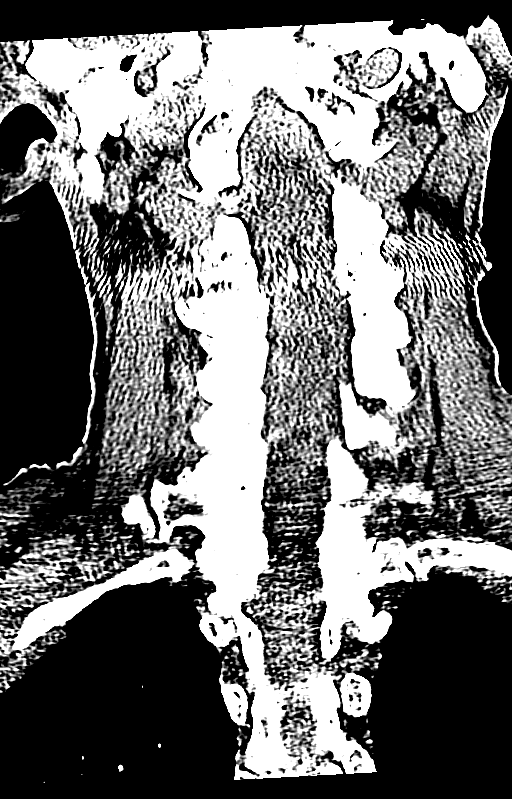

[Series 12: axial · axial · 0.22mm/px · z∈[-315,-189]mm · 8 of 91 slices shown, 10 images]
[im 8/91  brain]
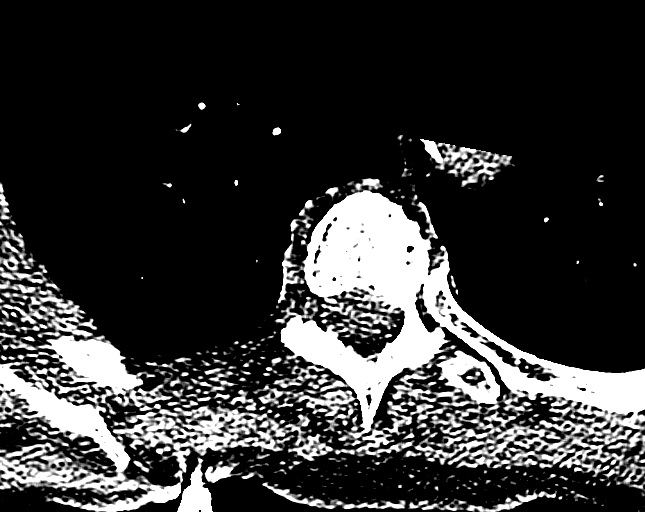
[im 8/91  bone]
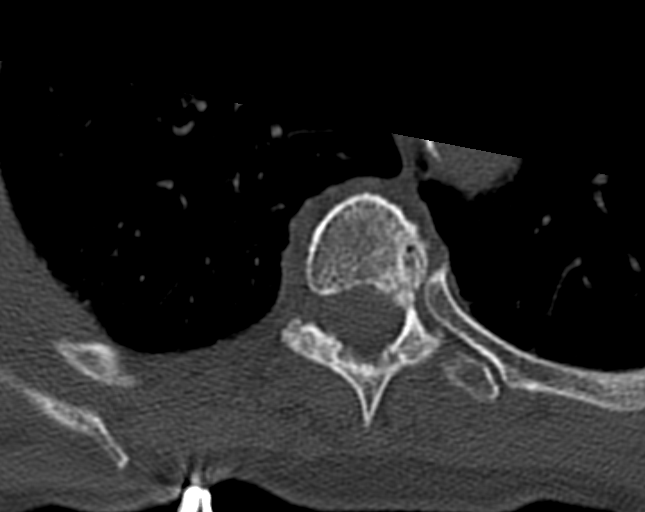
[im 23/91  brain]
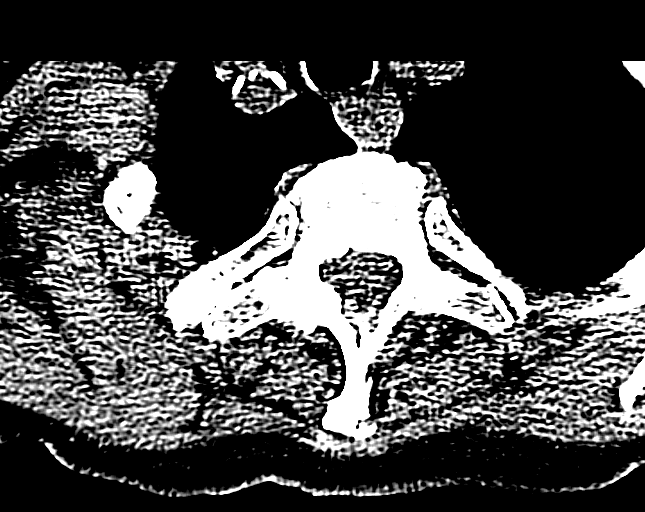
[im 31/91  brain]
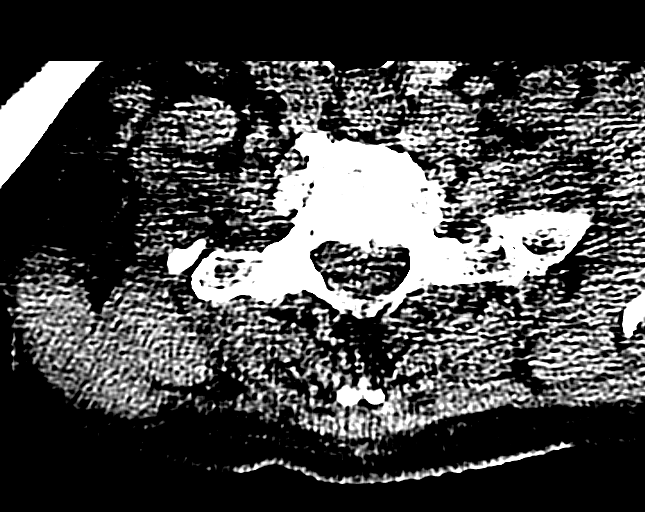
[im 38/91  brain]
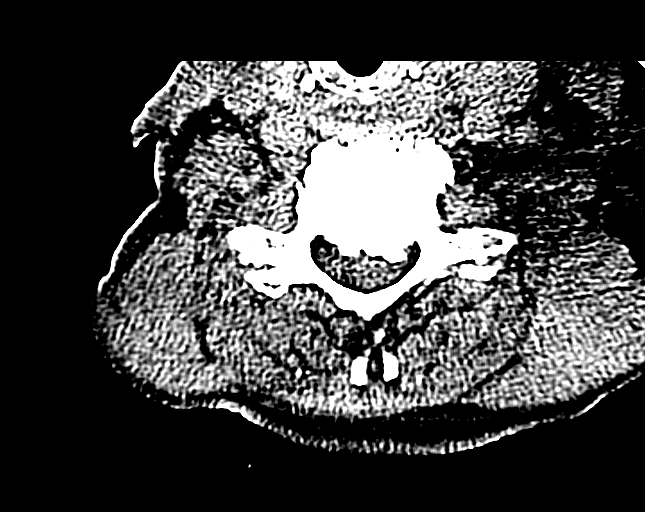
[im 53/91  brain]
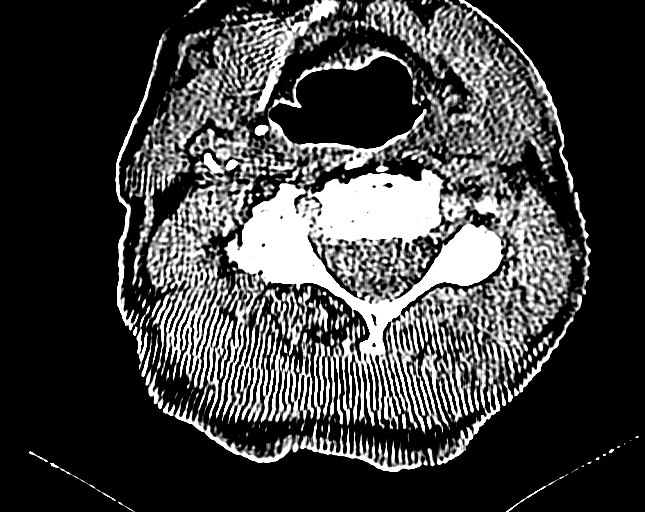
[im 53/91  bone]
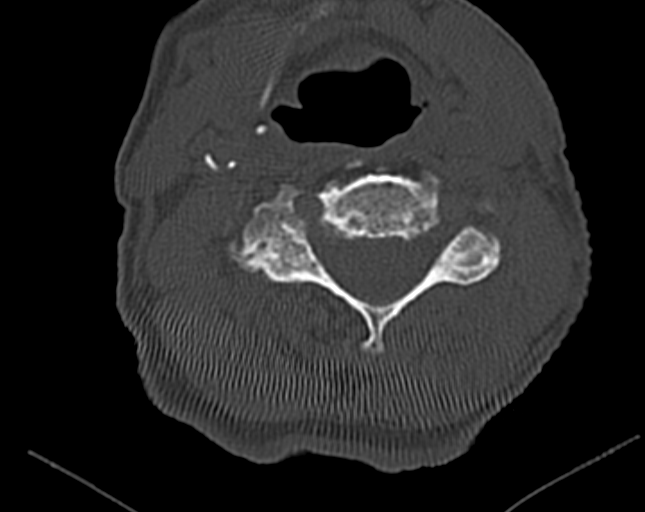
[im 61/91  brain]
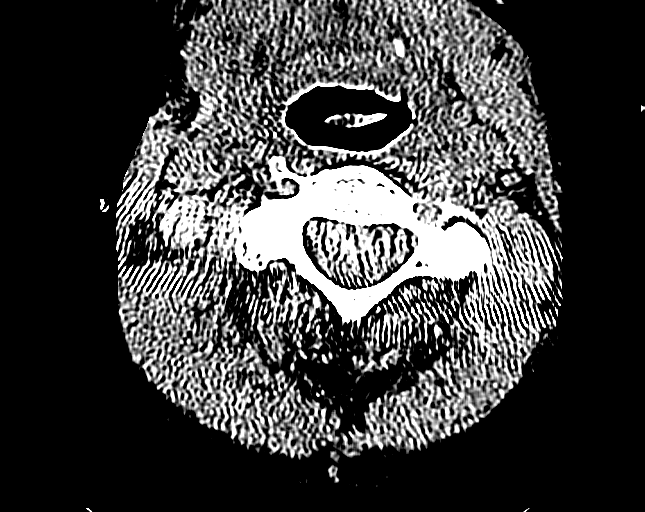
[im 68/91  brain]
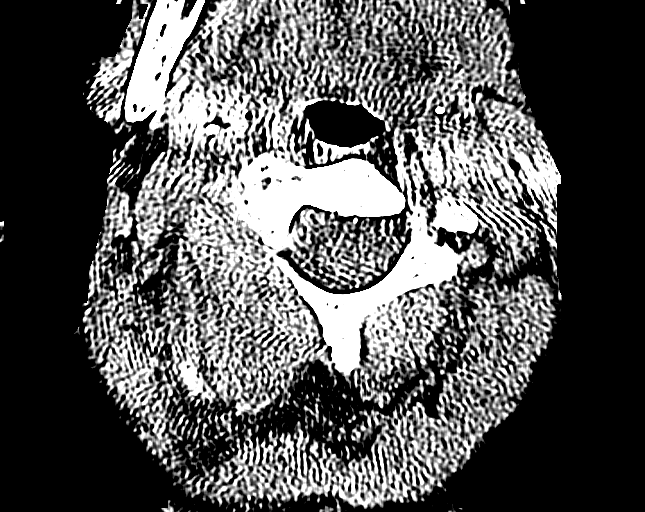
[im 83/91  brain]
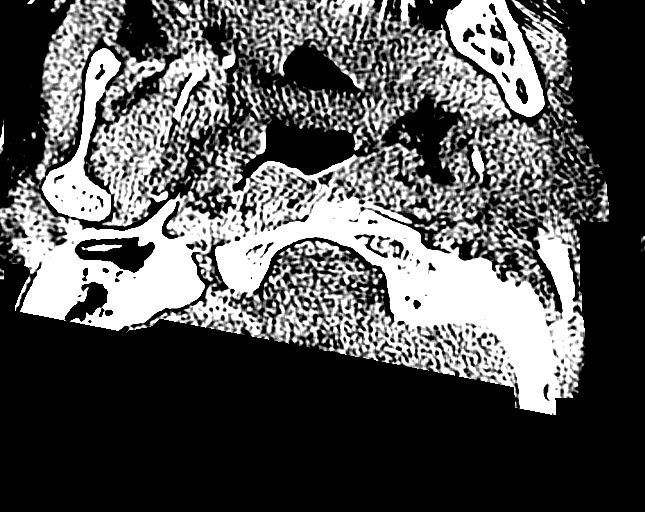

[11 of 47 positions shown; findings below may reference images not displayed]

FINDINGS: CT HEAD FINDINGS

Stable generalized atrophy and mild chronic small vessel ischemia.No
intracranial hemorrhage, mass effect, or midline shift. No
hydrocephalus. The basilar cisterns are patent. No evidence of
territorial infarct. No intracranial fluid collection. Calvarium is
intact. Included paranasal sinuses and mastoid air cells are well
aerated.

CT CERVICAL SPINE FINDINGS

No acute fracture or subluxation. Advanced multilevel degenerative
change throughout cervical spine. Diffuse disc space narrowing and
endplate spurring. Multilevel facet arthropathy. Degenerative type
anterolisthesis of C3 on C4 appears unchanged from prior. The dens
is intact. There are no jumped or perched facets.
IMPRESSION: 1. No acute intracranial abnormality. Stable atrophy and chronic
small vessel ischemia.
2. Advanced degenerative change throughout cervical spine without
acute fracture or subluxation.

## 2017-09-29 ENCOUNTER — Emergency Department
Admission: EM | Admit: 2017-09-29 | Discharge: 2017-09-29 | Disposition: A | Discharge status: Home / Self Care | Payer: Medicare Other | Attending: Emergency Medicine | Admitting: Emergency Medicine

## 2017-09-29 ENCOUNTER — Encounter: Payer: Self-pay | Admitting: Emergency Medicine

## 2017-09-29 ENCOUNTER — Emergency Department: Payer: Medicare Other

## 2017-09-29 ENCOUNTER — Other Ambulatory Visit: Payer: Self-pay

## 2017-09-29 DIAGNOSIS — Y92129 Unspecified place in nursing home as the place of occurrence of the external cause: Secondary | ICD-10-CM | POA: Diagnosis not present

## 2017-09-29 DIAGNOSIS — Y999 Unspecified external cause status: Secondary | ICD-10-CM | POA: Diagnosis not present

## 2017-09-29 DIAGNOSIS — I2510 Atherosclerotic heart disease of native coronary artery without angina pectoris: Secondary | ICD-10-CM | POA: Insufficient documentation

## 2017-09-29 DIAGNOSIS — Z79899 Other long term (current) drug therapy: Secondary | ICD-10-CM | POA: Diagnosis not present

## 2017-09-29 DIAGNOSIS — Y939 Activity, unspecified: Secondary | ICD-10-CM | POA: Insufficient documentation

## 2017-09-29 DIAGNOSIS — E039 Hypothyroidism, unspecified: Secondary | ICD-10-CM | POA: Insufficient documentation

## 2017-09-29 DIAGNOSIS — S0181XA Laceration without foreign body of other part of head, initial encounter: Secondary | ICD-10-CM | POA: Diagnosis not present

## 2017-09-29 DIAGNOSIS — S0990XA Unspecified injury of head, initial encounter: Secondary | ICD-10-CM | POA: Diagnosis present

## 2017-09-29 DIAGNOSIS — W19XXXA Unspecified fall, initial encounter: Secondary | ICD-10-CM | POA: Diagnosis not present

## 2017-09-29 DIAGNOSIS — I10 Essential (primary) hypertension: Secondary | ICD-10-CM | POA: Insufficient documentation

## 2017-09-29 LAB — CBC WITH DIFFERENTIAL/PLATELET
Basophils Absolute: 0.1 10*3/uL (ref 0–0.1)
Basophils Relative: 1 %
Eosinophils Absolute: 0.2 10*3/uL (ref 0–0.7)
Eosinophils Relative: 3 %
HEMATOCRIT: 42.6 % (ref 35.0–47.0)
HEMOGLOBIN: 14 g/dL (ref 12.0–16.0)
LYMPHS ABS: 1.6 10*3/uL (ref 1.0–3.6)
Lymphocytes Relative: 29 %
MCH: 30.5 pg (ref 26.0–34.0)
MCHC: 32.9 g/dL (ref 32.0–36.0)
MCV: 92.5 fL (ref 80.0–100.0)
MONOS PCT: 10 %
Monocytes Absolute: 0.6 10*3/uL (ref 0.2–0.9)
NEUTROS ABS: 3.1 10*3/uL (ref 1.4–6.5)
NEUTROS PCT: 57 %
Platelets: 167 10*3/uL (ref 150–440)
RBC: 4.6 MIL/uL (ref 3.80–5.20)
RDW: 13.5 % (ref 11.5–14.5)
WBC: 5.4 10*3/uL (ref 3.6–11.0)

## 2017-09-29 MED ORDER — LIDOCAINE-EPINEPHRINE 1 %-1:100000 IJ SOLN
30.0000 mL | Freq: Once | INTRAMUSCULAR | Status: DC
  Filled 2017-09-29 (×2): qty 30

## 2017-09-29 MED ORDER — HALOPERIDOL LACTATE 5 MG/ML IJ SOLN
2.0000 mg | Freq: Once | INTRAMUSCULAR | Status: AC
  Administered 2017-09-29: 2 mg via INTRAVENOUS
  Filled 2017-09-29: qty 1

## 2017-09-29 MED ORDER — LORAZEPAM 2 MG/ML IJ SOLN
INTRAMUSCULAR | Status: AC
  Administered 2017-09-29: 1 mg via INTRAVENOUS
  Filled 2017-09-29: qty 1

## 2017-09-29 MED ORDER — LORAZEPAM 2 MG/ML IJ SOLN
1.0000 mg | Freq: Once | INTRAMUSCULAR | Status: AC
  Administered 2017-09-29: 1 mg via INTRAVENOUS

## 2017-09-29 MED ORDER — HALOPERIDOL LACTATE 5 MG/ML IJ SOLN
3.0000 mg | Freq: Once | INTRAMUSCULAR | Status: AC
  Administered 2017-09-29: 3 mg via INTRAVENOUS

## 2017-09-29 NOTE — ED Provider Notes (Signed)
[facility] Emergency Department Provider Note   ____________________________________________   I have reviewed the triage vital signs and the nursing notes.   HISTORY  Chief Complaint Head Injury and Fall   History limited by and level 5 caveat due to dementia   HPI Wanda Mitchell is a 83 y.o. female who presents to the emergency department today via EMS after unwitnessed fall and head injury.   LOCATION:forehead DURATION:today CONTEXT: patient from memory care living facility. Had unwitnessed fall. Was found on the ground.   Per medical record review patient has a history of dementia, has been evaluated in the ED before for falls.   Past Medical History:  Diagnosis Date  . Anemia   . Anxiety   . CAD (coronary artery disease)   . Dementia   . Hypertension   . Hypokalemia   . Hypothyroid   . Urgency incontinence   . UTI (urinary tract infection)     There are no active problems to display for this patient.   No past surgical history on file.  Prior to Admission medications   Medication Sig Start Date End Date Taking? Authorizing Provider  acetaminophen (TYLENOL) 500 MG tablet Take 500 mg by mouth 3 (three) times daily.    [provider]  loperamide (IMODIUM) 2 MG capsule Take 2 mg by mouth as needed for diarrhea or loose stools.    [provider]  loratadine (CLARITIN) 10 MG tablet Take 10 mg by mouth daily.    [provider]  mirtazapine (REMERON) 7.5 MG tablet Take 7.5 mg by mouth at bedtime.    [provider]  potassium chloride (K-DUR) 10 MEQ tablet Take 1 tablet (10 mEq total) by mouth 3 (three) times daily. Patient not taking: Reported on 08/21/2016 06/03/16   Minna AntisPaduchowski, Kevin, MD  potassium chloride SA (K-DUR,KLOR-CON) 20 MEQ tablet Take 40 mEq by mouth daily.     [provider]  traMADol (ULTRAM) 50 MG tablet Take 50 mg by mouth 2 (two) times daily.    [provider]   triamterene-hydrochlorothiazide (MAXZIDE-25) 37.5-25 MG tablet Take 1 tablet by mouth daily.    [provider]    Allergies Patient has no known allergies.  No family history on file.  Social History Social History   Tobacco Use  . Smoking status: Never Smoker  . Smokeless tobacco: Never Used  Substance Use Topics  . Alcohol use: No  . Drug use: No    Review of Systems Unable to obtain secondary to dementia  ____________________________________________   PHYSICAL EXAM:  VITAL SIGNS: ED Triage Vitals  Enc Vitals Group     BP 156/65     Pulse 64     Resp 16     Temp 98     Temp src      SpO2 96   Constitutional: Awake. Not oriented. No acute distress. Eyes: Conjunctivae are normal.  ENT   Head: Normocephalic. Large v-shaped laceration to forehead, measured 8.5 cm.    Nose: No congestion/rhinnorhea.   Mouth/Throat: Mucous membranes are moist.   Neck: No stridor. Hematological/Lymphatic/Immunilogical: No cervical lymphadenopathy. Cardiovascular: Normal rate, regular rhythm.  No murmurs, rubs, or gallops.  Respiratory: Normal respiratory effort without tachypnea nor retractions. Breath sounds are clear and equal bilaterally. No wheezes/rales/rhonchi. Gastrointestinal: Soft and non tender. No rebound. No guarding.  Genitourinary: Deferred Musculoskeletal: Normal range of motion in all extremities. No lower extremity edema. Neurologic:  Not oriented. Appears to move all extremities.  Skin:  Skin is warm, dry. Large laceration to forehead. ____________________________________________    LABS (pertinent positives/negatives)  CBC wnl  ____________________________________________   EKG  None  ____________________________________________    RADIOLOGY  CT head/cervical spine No acute fracture  ____________________________________________   PROCEDURES  Procedures  LACERATION REPAIR Performed by: Phineas SemenGOODMAN, Teia Freitas Authorized  by: Phineas SemenGOODMAN, Marykay Mccleod Consent: Verbal consent obtained. Consent given by: son Patient identity confirmed: provided demographic data Prepped and Draped in normal sterile fashion Wound explored  Laceration Location: forhead  Laceration Length: 8.5 cm  No Foreign Bodies seen or palpated  Anesthesia: local infiltration  Local anesthetic: lidocaine 1% with epinephrine  Anesthetic total: 5 ml  Irrigation method: syringe Amount of cleaning: standard  Skin closure: 5-0 vicryl rapide  Number of sutures: 15  Technique: simple interrupted  Patient tolerance: Patient tolerated the procedure well with no immediate complications.  ____________________________________________   INITIAL IMPRESSION / ASSESSMENT AND PLAN / ED COURSE  Pertinent labs & imaging results that were available during my care of the patient were reviewed by me and considered in my medical decision making (see chart for details).  Patient presented to the emergency department today after a fall and forehead laceration.  Given the mechanism CT scans were obtained to evaluate for acute fracture of the skull or neck as well as intracranial bleed.  These were both negative.  The lacerations were sutured closed.  I had ordered blood work given that this was an unwitnessed fall.  However after the son arrived and in discussions with him she has been falling somewhat more recently.  He did not feel that we needed to check further blood work.  He felt comfortable letting her return to the living facility.  ____________________________________________   FINAL CLINICAL IMPRESSION(S) / ED DIAGNOSES  Final diagnoses:  Fall, initial encounter  Laceration of forehead, initial encounter     Note: This dictation was prepared with Nurse, children'sDragon dictation. Any transcriptional errors that result from this process are unintentional     Phineas SemenGoodman, Gerrit Rafalski, MD 09/29/17 2224

## 2017-09-29 NOTE — ED Notes (Signed)
Patient transported to CT 

## 2017-09-29 NOTE — ED Notes (Signed)
Son at bedside, states he wishes for [facility] to transport pt back to SNF

## 2017-09-29 NOTE — ED Triage Notes (Signed)
[facility], pt had 2 falls today, after 1st fall, pt was placed back in bed, second time pt was found on the floor by staff with large laceration on forehead.   Pt confused and screaming in arrival. Blood draining slowing from laceration. Pt attempting to remove new dressing placed by ED staff on arrival. Pt is DNR, paperwork at bedside.  Pt not on blood thinners.

## 2017-09-29 NOTE — ED Notes (Signed)
[facility] called for transport to [facility]

## 2017-09-29 NOTE — ED Notes (Signed)
Contacted pharmacy for lidocaine w/epi for this patient.

## 2017-09-29 NOTE — Discharge Instructions (Signed)
The sutures that were placed today will dissolve on their own in about 7-10 days. Please have Ms. Wanda Mitchell be seen for any concern for infection from the wound, redness, foul smelling discharge, fever or any other new or concerning symptoms.

## 2017-12-14 DEATH — deceased
